# Patient Record
Sex: Female | Born: 1980 | Race: White | Hispanic: No | Marital: Single | Smoking: Former smoker
Health system: Southern US, Community
[De-identification: ages and names within clinical notes are randomized; demographics above are authoritative.]

## PROBLEM LIST (undated history)

## (undated) DIAGNOSIS — F419 Anxiety disorder, unspecified: Secondary | ICD-10-CM

## (undated) DIAGNOSIS — F909 Attention-deficit hyperactivity disorder, unspecified type: Secondary | ICD-10-CM

## (undated) HISTORY — PX: TONSILLECTOMY: SUR1361

## (undated) HISTORY — PX: OTHER SURGICAL HISTORY: SHX169

---

## 1999-03-11 ENCOUNTER — Inpatient Hospital Stay (HOSPITAL_COMMUNITY): Admission: AD | Admit: 1999-03-11 | Discharge: 1999-03-11 | Payer: Self-pay | Admitting: Obstetrics

## 2000-02-07 ENCOUNTER — Encounter: Payer: Self-pay | Admitting: Emergency Medicine

## 2000-02-07 ENCOUNTER — Emergency Department (HOSPITAL_COMMUNITY): Admission: EM | Admit: 2000-02-07 | Discharge: 2000-02-07 | Payer: Self-pay | Admitting: Emergency Medicine

## 2000-08-21 ENCOUNTER — Emergency Department (HOSPITAL_COMMUNITY): Admission: EM | Admit: 2000-08-21 | Discharge: 2000-08-21 | Payer: Self-pay | Admitting: *Deleted

## 2000-09-13 ENCOUNTER — Encounter: Admission: RE | Admit: 2000-09-13 | Discharge: 2000-09-13 | Payer: Self-pay | Admitting: Sports Medicine

## 2000-09-13 ENCOUNTER — Encounter: Payer: Self-pay | Admitting: Sports Medicine

## 2000-10-04 ENCOUNTER — Encounter: Payer: Self-pay | Admitting: Sports Medicine

## 2000-10-04 ENCOUNTER — Ambulatory Visit (HOSPITAL_COMMUNITY): Admission: RE | Admit: 2000-10-04 | Discharge: 2000-10-04 | Payer: Self-pay | Admitting: Sports Medicine

## 2001-06-11 ENCOUNTER — Observation Stay (HOSPITAL_COMMUNITY): Admission: RE | Admit: 2001-06-11 | Discharge: 2001-06-12 | Payer: Self-pay | Admitting: Specialist

## 2001-09-17 ENCOUNTER — Encounter: Admission: RE | Admit: 2001-09-17 | Discharge: 2001-09-17 | Payer: Self-pay | Admitting: *Deleted

## 2002-04-03 ENCOUNTER — Encounter: Payer: Self-pay | Admitting: Orthopedic Surgery

## 2002-04-04 ENCOUNTER — Inpatient Hospital Stay (HOSPITAL_COMMUNITY): Admission: RE | Admit: 2002-04-04 | Discharge: 2002-04-05 | Payer: Self-pay | Admitting: Orthopedic Surgery

## 2002-07-25 ENCOUNTER — Observation Stay (HOSPITAL_COMMUNITY): Admission: AD | Admit: 2002-07-25 | Discharge: 2002-07-25 | Payer: Self-pay | Admitting: *Deleted

## 2004-11-22 ENCOUNTER — Other Ambulatory Visit: Admission: RE | Admit: 2004-11-22 | Discharge: 2004-11-22 | Payer: Self-pay | Admitting: Obstetrics and Gynecology

## 2011-08-09 ENCOUNTER — Encounter: Payer: Self-pay | Admitting: *Deleted

## 2011-08-10 ENCOUNTER — Ambulatory Visit: Payer: Self-pay | Admitting: Women's Health

## 2012-01-14 ENCOUNTER — Ambulatory Visit (INDEPENDENT_AMBULATORY_CARE_PROVIDER_SITE_OTHER): Payer: 59 | Admitting: Women's Health

## 2012-01-14 ENCOUNTER — Other Ambulatory Visit (HOSPITAL_COMMUNITY)
Admission: RE | Admit: 2012-01-14 | Discharge: 2012-01-14 | Disposition: A | Discharge status: Home / Self Care | Payer: 59 | Source: Ambulatory Visit | Attending: Obstetrics and Gynecology | Admitting: Obstetrics and Gynecology

## 2012-01-14 ENCOUNTER — Encounter: Payer: Self-pay | Admitting: Women's Health

## 2012-01-14 VITALS — BP 110/78 | Ht 68.0 in | Wt 265.0 lb

## 2012-01-14 DIAGNOSIS — IMO0001 Reserved for inherently not codable concepts without codable children: Secondary | ICD-10-CM

## 2012-01-14 DIAGNOSIS — Z309 Encounter for contraceptive management, unspecified: Secondary | ICD-10-CM

## 2012-01-14 DIAGNOSIS — Z13220 Encounter for screening for lipoid disorders: Secondary | ICD-10-CM

## 2012-01-14 DIAGNOSIS — Z01419 Encounter for gynecological examination (general) (routine) without abnormal findings: Secondary | ICD-10-CM | POA: Insufficient documentation

## 2012-01-14 DIAGNOSIS — F17200 Nicotine dependence, unspecified, uncomplicated: Secondary | ICD-10-CM

## 2012-01-14 DIAGNOSIS — Z833 Family history of diabetes mellitus: Secondary | ICD-10-CM

## 2012-01-14 DIAGNOSIS — Z113 Encounter for screening for infections with a predominantly sexual mode of transmission: Secondary | ICD-10-CM

## 2012-01-14 DIAGNOSIS — E669 Obesity, unspecified: Secondary | ICD-10-CM

## 2012-01-14 DIAGNOSIS — F17210 Nicotine dependence, cigarettes, uncomplicated: Secondary | ICD-10-CM

## 2012-01-14 MED ORDER — NORETHIN ACE-ETH ESTRAD-FE 1-20 MG-MCG PO TABS: 1.0000 | ORAL_TABLET | Freq: Every day | ORAL | Status: DC

## 2012-01-14 NOTE — Progress Notes (Signed)
Alexandra Schmitt Dec 20, 1980 [id number]    History:    The patient presents for annual exam.  Monthly 6-7 day cycle/condoms and withdrawal. Requested oral contraceptives used in the past without problem. Smokes less than half pack of cigarettes per day. Minimal healthcare in approximately 10 years. Occasional urinary frequency without pain. Complains of occasional anxiety/situation.  Past medical history, past surgical history, family history and social history were all reviewed and documented in the EPIC chart. Armed forces training and education officer. History of a 32 year old son placed for adoption of birth.   ROS:  A  ROS was performed and pertinent positives and negatives are included in the history.  Exam:  Filed Vitals:   01/14/12 0936  BP: 110/78    General appearance:  Normal Head/Neck:  Normal, without cervical or supraclavicular adenopathy. Thyroid:  Symmetrical, normal in size, without palpable masses or nodularity. Respiratory  Effort:  Normal  Auscultation:  Clear without wheezing or rhonchi Cardiovascular  Auscultation:  Regular rate, without rubs, murmurs or gallops  Edema/varicosities:  Not grossly evident Abdominal  Soft,nontender, without masses, guarding or rebound.  Liver/spleen:  No organomegaly noted  Hernia:  None appreciated  Skin  Inspection:  Grossly normal  Palpation:  Grossly normal Neurologic/psychiatric  Orientation:  Normal with appropriate conversation.  Mood/affect:  Normal  Genitourinary    Breasts: Examined lying and sitting.     Right: Without masses, retractions, discharge or axillary adenopathy.     Left: Without masses, retractions, discharge or axillary adenopathy.   Inguinal/mons:  Normal without inguinal adenopathy  External genitalia:  Normal  BUS/Urethra/Skene's glands:  Normal  Bladder:  Normal  Vagina:  Normal  Cervix:  Normal  Uterus:  normal in size, shape and contour.  Midline and mobile  Adnexa/parametria:     Rt: Without masses or  tenderness.   Lt: Without masses or tenderness.  Anus and perineum: Normal  Digital rectal exam: Normal sphincter tone without palpated masses or tenderness  Assessment/Plan:  31 y.o. S WF G1 P1(adopted) for annual exam.     Minimal healthcare for 10 years Normal GYN exam desiring contraception STD screening Obesity Smoker less than one half pack per day  Plan: Contraception options reviewed, Loestrin 1/20 prescription, proper use, slight risk for blood clots and strokes reviewed. Start up instructions condoms especially until then and first month and for infection control encouraged. Smoking cessation discussed, declines chantix. SBE's, increase exercise, decrease calories for wt loss, calcium rich diet, MVI daily encouraged. CBC, glucose, lipid profile, UA, Pap, GC/Chlamydia, HIV, hep B and C., RPR.    Rosalie Buenaventura J WHNP, 1:21 PM 01/14/2012

## 2012-01-14 NOTE — Patient Instructions (Signed)

## 2012-01-15 LAB — URINALYSIS W MICROSCOPIC + REFLEX CULTURE
Bacteria, UA: NONE SEEN
Crystals: NONE SEEN
Ketones, ur: NEGATIVE mg/dL
Nitrite: NEGATIVE
Protein, ur: NEGATIVE mg/dL
Specific Gravity, Urine: 1.027 (ref 1.005–1.030)
Urobilinogen, UA: 0.2 mg/dL (ref 0.0–1.0)

## 2012-01-15 LAB — CBC WITH DIFFERENTIAL/PLATELET
Basophils Absolute: 0 10*3/uL (ref 0.0–0.1)
Basophils Relative: 1 % (ref 0–1)
Eosinophils Relative: 2 % (ref 0–5)
HCT: 46.1 % — ABNORMAL HIGH (ref 36.0–46.0)
MCH: 30.2 pg (ref 26.0–34.0)
MCHC: 31.9 g/dL (ref 30.0–36.0)
MCV: 94.9 fL (ref 78.0–100.0)
Monocytes Absolute: 0.5 10*3/uL (ref 0.1–1.0)
RDW: 13.5 % (ref 11.5–15.5)

## 2012-01-15 LAB — HEPATITIS C ANTIBODY: HCV Ab: NEGATIVE

## 2012-01-15 LAB — HIV ANTIBODY (ROUTINE TESTING W REFLEX): HIV: NONREACTIVE

## 2012-01-15 LAB — LIPID PANEL: Total CHOL/HDL Ratio: 5.8 Ratio

## 2012-01-15 LAB — GC/CHLAMYDIA PROBE AMP, GENITAL: GC Probe Amp, Genital: NEGATIVE

## 2012-06-16 ENCOUNTER — Encounter: Payer: Self-pay | Admitting: Gynecology

## 2012-06-16 ENCOUNTER — Ambulatory Visit (INDEPENDENT_AMBULATORY_CARE_PROVIDER_SITE_OTHER): Payer: BC Managed Care – PPO | Admitting: Gynecology

## 2012-06-16 VITALS — BP 134/90

## 2012-06-16 DIAGNOSIS — F32A Depression, unspecified: Secondary | ICD-10-CM | POA: Insufficient documentation

## 2012-06-16 DIAGNOSIS — F329 Major depressive disorder, single episode, unspecified: Secondary | ICD-10-CM | POA: Insufficient documentation

## 2012-06-16 MED ORDER — VILAZODONE HCL 10 MG PO TABS: 10.0000 mg | ORAL_TABLET | Freq: Every day | ORAL | Status: DC

## 2012-06-16 NOTE — Patient Instructions (Addendum)
Depression  Depression is a strong emotion of feeling unhappy that can last for weeks, months, or even longer. Depression causes problems with the ability to function in life. It upsets your:   Relationships.   Sleep.   Eating habits.   Work habits.  HOME CARE  Take all medicine as told by your doctor.   Talk with a therapist, counselor, or friend.   Eat a healthy diet.   Exercise regularly.   Do not drink alcohol or use drugs.  GET HELP RIGHT AWAY IF: You start to have thoughts about hurting yourself or others. MAKE SURE YOU:  Understand these instructions.   Will watch your condition.   Will get help right away if you are not doing well or get worse.  Document Released: 12/01/2010 Document Revised: 10/18/2011 Document Reviewed: 12/01/2010 [facility] Patient Information 2012 [place], [place].  Anxiety and Panic Attacks Your caregiver has informed you that you are having an anxiety or panic attack. There may be many forms of this. Most of the time these attacks come suddenly and without warning. They come at any time of day, including periods of sleep, and at any time of life. They may be strong and unexplained. Although panic attacks are very scary, they are physically harmless. Sometimes the cause of your anxiety is not known. Anxiety is a protective mechanism of the body in its fight or flight mechanism. Most of these perceived danger situations are actually nonphysical situations (such as anxiety over losing a job). CAUSES  The causes of an anxiety or panic attack are many. Panic attacks may occur in otherwise healthy people given a certain set of circumstances. There may be a genetic cause for panic attacks. Some medications may also have anxiety as a side effect. SYMPTOMS  Some of the most common feelings are:  Intense terror.   Dizziness, feeling faint.   Hot and cold flashes.   Fear of going crazy.   Feelings that nothing is real.   Sweating.   Shaking.    Chest pain or a fast heartbeat (palpitations).   Smothering, choking sensations.   Feelings of impending doom and that death is near.   Tingling of extremities, this may be from over-breathing.   Altered reality (derealization).   Being detached from yourself (depersonalization).  Several symptoms can be present to make up anxiety or panic attacks. DIAGNOSIS  The evaluation by your caregiver will depend on the type of symptoms you are experiencing. The diagnosis of anxiety or panic attack is made when no physical illness can be determined to be a cause of the symptoms. TREATMENT  Treatment to prevent anxiety and panic attacks may include:  Avoidance of circumstances that cause anxiety.   Reassurance and relaxation.   Regular exercise.   Relaxation therapies, such as yoga.   Psychotherapy with a psychiatrist or therapist.   Avoidance of caffeine, alcohol and illegal drugs.   Prescribed medication.  SEEK IMMEDIATE MEDICAL CARE IF:   You experience panic attack symptoms that are different than your usual symptoms.   You have any worsening or concerning symptoms.  Document Released: 10/29/2005 Document Revised: 10/18/2011 Document Reviewed: 03/02/2010 [facility] [place] [facility] Patient Information 2012 [place], [place].
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
## 2012-07-09 ENCOUNTER — Telehealth: Payer: Self-pay | Admitting: *Deleted

## 2012-07-09 ENCOUNTER — Telehealth: Payer: Self-pay | Admitting: Women's Health

## 2012-07-09 NOTE — Telephone Encounter (Signed)
Phone number unavailable.
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
## 2012-07-09 NOTE — Telephone Encounter (Signed)
Pt mother Alexandra Schmitt is calling to follow up with last OV 06/16/12. She would like for her daughter to proceed with a therapist or psychiatrist as directed in office note. Please advise

## 2012-07-09 NOTE — Telephone Encounter (Signed)
Per JF verbal message okay to set up appointment with Dr.McKinney office. Per Dr.McKinney office pt will need to call and speak with staff to go over appointment process. Mother given number to give to pt to call. Office notes faxed.

## 2014-09-13 ENCOUNTER — Encounter: Payer: Self-pay | Admitting: Gynecology

## 2015-02-17 ENCOUNTER — Other Ambulatory Visit: Payer: Self-pay | Admitting: Women's Health

## 2015-02-17 ENCOUNTER — Encounter: Payer: Self-pay | Admitting: Women's Health

## 2015-02-17 MED ORDER — NORETHINDRONE 0.35 MG PO TABS: 1.0000 | ORAL_TABLET | Freq: Every day | ORAL | Status: DC

## 2015-02-23 ENCOUNTER — Encounter: Payer: Self-pay | Admitting: Women's Health

## 2015-02-23 ENCOUNTER — Other Ambulatory Visit (HOSPITAL_COMMUNITY)
Admission: RE | Admit: 2015-02-23 | Discharge: 2015-02-23 | Disposition: A | Discharge status: Home / Self Care | Payer: BLUE CROSS/BLUE SHIELD | Source: Ambulatory Visit | Attending: Gynecology | Admitting: Gynecology

## 2015-02-23 ENCOUNTER — Ambulatory Visit (INDEPENDENT_AMBULATORY_CARE_PROVIDER_SITE_OTHER): Payer: BLUE CROSS/BLUE SHIELD | Admitting: Women's Health

## 2015-02-23 VITALS — BP 115/80 | Ht 66.0 in | Wt 264.0 lb

## 2015-02-23 DIAGNOSIS — Z833 Family history of diabetes mellitus: Secondary | ICD-10-CM | POA: Diagnosis not present

## 2015-02-23 DIAGNOSIS — Z13220 Encounter for screening for lipoid disorders: Secondary | ICD-10-CM | POA: Diagnosis not present

## 2015-02-23 DIAGNOSIS — Z01419 Encounter for gynecological examination (general) (routine) without abnormal findings: Secondary | ICD-10-CM | POA: Insufficient documentation

## 2015-02-23 DIAGNOSIS — Z30011 Encounter for initial prescription of contraceptive pills: Secondary | ICD-10-CM | POA: Diagnosis not present

## 2015-02-23 DIAGNOSIS — F17210 Nicotine dependence, cigarettes, uncomplicated: Secondary | ICD-10-CM

## 2015-02-23 LAB — CBC WITH DIFFERENTIAL/PLATELET
Basophils Absolute: 0 10*3/uL (ref 0.0–0.1)
Basophils Relative: 0 % (ref 0–1)
Eosinophils Absolute: 0.3 10*3/uL (ref 0.0–0.7)
Eosinophils Relative: 3 % (ref 0–5)
HEMATOCRIT: 43.2 % (ref 36.0–46.0)
Hemoglobin: 14.4 g/dL (ref 12.0–15.0)
LYMPHS ABS: 2.6 10*3/uL (ref 0.7–4.0)
LYMPHS PCT: 31 % (ref 12–46)
MCH: 29.6 pg (ref 26.0–34.0)
MCHC: 33.3 g/dL (ref 30.0–36.0)
MCV: 88.7 fL (ref 78.0–100.0)
MONO ABS: 0.7 10*3/uL (ref 0.1–1.0)
MONOS PCT: 8 % (ref 3–12)
MPV: 9.5 fL (ref 8.6–12.4)
NEUTROS ABS: 4.9 10*3/uL (ref 1.7–7.7)
Neutrophils Relative %: 58 % (ref 43–77)
Platelets: 380 10*3/uL (ref 150–400)
RBC: 4.87 MIL/uL (ref 3.87–5.11)
RDW: 14.2 % (ref 11.5–15.5)
WBC: 8.5 10*3/uL (ref 4.0–10.5)

## 2015-02-23 LAB — LIPID PANEL
CHOL/HDL RATIO: 6.4 ratio
Cholesterol: 187 mg/dL (ref 0–200)
HDL: 29 mg/dL — AB (ref >=46)
LDL Cholesterol: 99 mg/dL (ref 0–99)
Triglycerides: 293 mg/dL — ABNORMAL HIGH (ref <150)
VLDL: 59 mg/dL — AB (ref 0–40)

## 2015-02-23 LAB — GLUCOSE, RANDOM: GLUCOSE: 97 mg/dL (ref 70–99)

## 2015-02-23 MED ORDER — NORETHINDRONE 0.35 MG PO TABS: 1.0000 | ORAL_TABLET | Freq: Every day | ORAL | Status: DC

## 2015-02-23 NOTE — Patient Instructions (Signed)
Cardiac Diet A cardiac diet can help stop heart disease or a stroke from happening. It involves eating less unhealthy fats and eating more healthy fats.  FOODS TO AVOID OR LIMIT  Limit saturated fats. This type of fat is found in oils and dairy products, such as:  Coconut oil.  Palm oil.  Cocoa butter.  Butter.  Avoid trans-fat or hydrogenated oils. These are found in fried or pre-made baked goods, such as:  Margarine.  Pre-made cookies, cakes, and crackers.  Limit processed meats (hot dogs, deli meats, sausage) to 3 ounces a week.  Limit high-fat meats (marbled meats, fried chicken, or chicken with skin) to 3 ounces a week.  Limit salt (sodium) to 1500 milligrams a day.   Limit sweets and drinks with added sugar to no more than 5 servings a week. One serving is:  1 tablespoon of sugar.  1 tablespoon of jelly or jam.   cup sorbet.  1 cup lemonade.   cup regular soda. EAT MORE OF THE FOLLOWING FOODS Fruit  Eat 4to 5 servings a day. One serving of fruit is:  1 medium whole fruit.   cup dried fruit.   cup of fresh, frozen, or canned fruit.   cup 100% fruit juice. Vegetables  Eat 4 to 5 servings a day. One serving is:  1 cup raw leafy vegetables.   cup raw or cooked, cut-up vegetables.   cup vegetable juice. Whole Grains  Eat 3 servings a day (1 ounce equals 1 serving). Legumes (such as beans, peas, and lentils)   Eat at least 4 servings a week ( cup equals 1 serving). Nuts and Seeds   Eat at least 4 servings a week ( cup equals 1 serving). Dietary Fiber  Eat 20 to 30 grams a day. Some foods high in dietary fiber include:  Dried beans.  Citrus fruits.  Apples, bananas.  Broccoli, Brussels sprouts, and eggplant.  Oats. Omega-3 Fats  Eat food with omega-3 fats. You can also take a dietary pill (supplement) that has 1 gram of DHA and EPA. Have 3.5 ounces of fatty fish a week, such as:  Salmon.  Mackerel.  [place]  tuna.  Sardines.  Lake trout.  Herring. PREPARING YOUR FOOD  Broil, bake, steam, or roast foods. Do not fry food. Do not cook food in butter (fat).  Use non-stick cooking sprays.  Remove skin from poultry, such as chicken and [place].  Remove fat from meat.  Take the fat off the top of stews, soups, and gravy.  Use lemon or herbs to flavor food instead of using butter or margarine.  Use nonfat yogurt, salsa, or low-fat dressings for salads. Document Released: 04/29/2012 Document Reviewed: 04/29/2012 [facility] Patient Information 2015 [place]. This information is not intended to replace advice given to you by your health care provider. Make sure you discuss any questions you have with your health care provider. Exercise to Stay Healthy Exercise helps you become and stay healthy. EXERCISE IDEAS AND TIPS Choose exercises that:  You enjoy.  Fit into your day. You do not need to exercise really hard to be healthy. You can do exercises at a slow or medium level and stay healthy. You can:  Stretch before and after working out.  Try yoga, Pilates, or tai chi.  Lift weights.  Walk fast, swim, jog, run, climb stairs, bicycle, dance, or rollerskate.  Take aerobic classes. Exercises that burn about 150 calories:  Running 1  miles in 15 minutes.  Playing volleyball for  45 to 60 minutes.  Washing and waxing a car for 45 to 60 minutes.  Playing touch football for 45 minutes.  Walking 1  miles in 35 minutes.  Pushing a stroller 1  miles in 30 minutes.  Playing basketball for 30 minutes.  Raking leaves for 30 minutes.  Bicycling 5 miles in 30 minutes.  Walking 2 miles in 30 minutes.  Dancing for 30 minutes.  Shoveling snow for 15 minutes.  Swimming laps for 20 minutes.  Walking up stairs for 15 minutes.  Bicycling 4 miles in 15 minutes.  Gardening for 30 to 45 minutes.  Jumping rope for 15 minutes.  Washing windows or floors for 45 to 60  minutes. Document Released: 12/01/2010 Document Revised: 01/21/2012 Document Reviewed: 12/01/2010 [facility] Patient Information 2015 [place], [place]. This information is not intended to replace advice given to you by your health care provider. Make sure you discuss any questions you have with your health care provider. Health Maintenance Adopting a healthy lifestyle and getting preventive care can go a long way to promote health and wellness. Talk with your health care provider about what schedule of regular examinations is right for you. This is a good chance for you to check in with your provider about disease prevention and staying healthy. In between checkups, there are plenty of things you can do on your own. Experts have done a lot of research about which lifestyle changes and preventive measures are most likely to keep you healthy. Ask your health care provider for more information. WEIGHT AND DIET  Eat a healthy diet  Be sure to include plenty of vegetables, fruits, low-fat dairy products, and lean protein.  Do not eat a lot of foods high in solid fats, added sugars, or salt.  Get regular exercise. This is one of the most important things you can do for your health.  Most adults should exercise for at least 150 minutes each week. The exercise should increase your heart rate and make you sweat (moderate-intensity exercise).  Most adults should also do strengthening exercises at least twice a week. This is in addition to the moderate-intensity exercise.  Maintain a healthy weight  Body mass index (BMI) is a measurement that can be used to identify possible weight problems. It estimates body fat based on height and weight. Your health care provider can help determine your BMI and help you achieve or maintain a healthy weight.  For females 87 years of age and older:   A BMI below 18.5 is considered underweight.  A BMI of 18.5 to 24.9 is normal.  A BMI of 25 to 29.9 is considered  overweight.  A BMI of 30 and above is considered obese.  Watch levels of cholesterol and blood lipids  You should start having your blood tested for lipids and cholesterol at 35 years of age, then have this test every 5 years.  You may need to have your cholesterol levels checked more often if:  Your lipid or cholesterol levels are high.  You are older than 35 years of age.  You are at high risk for heart disease.  CANCER SCREENING   Lung Cancer  Lung cancer screening is recommended for adults 75-88 years old who are at high risk for lung cancer because of a history of smoking.  A yearly low-dose CT scan of the lungs is recommended for people who:  Currently smoke.  Have quit within the past 15 years.  Have at least a 30-pack-year history of smoking.  A pack year is smoking an average of one pack of cigarettes a day for 1 year.  Yearly screening should continue until it has been 15 years since you quit.  Yearly screening should stop if you develop a health problem that would prevent you from having lung cancer treatment.  Breast Cancer  Practice breast self-awareness. This means understanding how your breasts normally appear and feel.  It also means doing regular breast self-exams. Let your health care provider know about any changes, no matter how small.  If you are in your 20s or 30s, you should have a clinical breast exam (CBE) by a health care provider every 1-3 years as part of a regular health exam.  If you are 13 or older, have a CBE every year. Also consider having a breast X-ray (mammogram) every year.  If you have a family history of breast cancer, talk to your health care provider about genetic screening.  If you are at high risk for breast cancer, talk to your health care provider about having an MRI and a mammogram every year.  Breast cancer gene (BRCA) assessment is recommended for women who have family members with BRCA-related cancers. BRCA-related  cancers include:  Breast.  Ovarian.  Tubal.  Peritoneal cancers.  Results of the assessment will determine the need for genetic counseling and BRCA1 and BRCA2 testing. Cervical Cancer Routine pelvic examinations to screen for cervical cancer are no longer recommended for nonpregnant women who are considered low risk for cancer of the pelvic organs (ovaries, uterus, and vagina) and who do not have symptoms. A pelvic examination may be necessary if you have symptoms including those associated with pelvic infections. Ask your health care provider if a screening pelvic exam is right for you.   The Pap test is the screening test for cervical cancer for women who are considered at risk.  If you had a hysterectomy for a problem that was not cancer or a condition that could lead to cancer, then you no longer need Pap tests.  If you are older than 65 years, and you have had normal Pap tests for the past 10 years, you no longer need to have Pap tests.  If you have had past treatment for cervical cancer or a condition that could lead to cancer, you need Pap tests and screening for cancer for at least 20 years after your treatment.  If you no longer get a Pap test, assess your risk factors if they change (such as having a new sexual partner). This can affect whether you should start being screened again.  Some women have medical problems that increase their chance of getting cervical cancer. If this is the case for you, your health care provider may recommend more frequent screening and Pap tests.  The human papillomavirus (HPV) test is another test that may be used for cervical cancer screening. The HPV test looks for the virus that can cause cell changes in the cervix. The cells collected during the Pap test can be tested for HPV.  The HPV test can be used to screen women 74 years of age and older. Getting tested for HPV can extend the interval between normal Pap tests from three to five  years.  An HPV test also should be used to screen women of any age who have unclear Pap test results.  After 35 years of age, women should have HPV testing as often as Pap tests.  Colorectal Cancer  This type of cancer can be  detected and often prevented.  Routine colorectal cancer screening usually begins at 35 years of age and continues through 35 years of age.  Your health care provider may recommend screening at an earlier age if you have risk factors for colon cancer.  Your health care provider may also recommend using home test kits to check for hidden blood in the stool.  A small camera at the end of a tube can be used to examine your colon directly (sigmoidoscopy or colonoscopy). This is done to check for the earliest forms of colorectal cancer.  Routine screening usually begins at age 34.  Direct examination of the colon should be repeated every 5-10 years through 35 years of age. However, you may need to be screened more often if early forms of precancerous polyps or small growths are found. Skin Cancer  Check your skin from head to toe regularly.  Tell your health care provider about any new moles or changes in moles, especially if there is a change in a mole's shape or color.  Also tell your health care provider if you have a mole that is larger than the size of a pencil eraser.  Always use sunscreen. Apply sunscreen liberally and repeatedly throughout the day.  Protect yourself by wearing long sleeves, pants, a wide-brimmed hat, and sunglasses whenever you are outside. HEART DISEASE, DIABETES, AND HIGH BLOOD PRESSURE   Have your blood pressure checked at least every 1-2 years. High blood pressure causes heart disease and increases the risk of stroke.  If you are between 74 years and 98 years old, ask your health care provider if you should take aspirin to prevent strokes.  Have regular diabetes screenings. This involves taking a blood sample to check your fasting  blood sugar level.  If you are at a normal weight and have a low risk for diabetes, have this test once every three years after 35 years of age.  If you are overweight and have a high risk for diabetes, consider being tested at a younger age or more often. PREVENTING INFECTION  Hepatitis B  If you have a higher risk for hepatitis B, you should be screened for this virus. You are considered at high risk for hepatitis B if:  You were born in a country where hepatitis B is common. Ask your health care provider which countries are considered high risk.  Your parents were born in a high-risk country, and you have not been immunized against hepatitis B (hepatitis B vaccine).  You have HIV or AIDS.  You use needles to inject street drugs.  You live with someone who has hepatitis B.  You have had sex with someone who has hepatitis B.  You get hemodialysis treatment.  You take certain medicines for conditions, including cancer, organ transplantation, and autoimmune conditions. Hepatitis C  Blood testing is recommended for:  Everyone born from [birth year] through 1966.  Anyone with known risk factors for hepatitis C. Sexually transmitted infections (STIs)  You should be screened for sexually transmitted infections (STIs) including gonorrhea and chlamydia if:  You are sexually active and are younger than 35 years of age.  You are older than 35 years of age and your health care provider tells you that you are at risk for this type of infection.  Your sexual activity has changed since you were last screened and you are at an increased risk for chlamydia or gonorrhea. Ask your health care provider if you are at risk.  If you do not  have HIV, but are at risk, it may be recommended that you take a prescription medicine daily to prevent HIV infection. This is called pre-exposure prophylaxis (PrEP). You are considered at risk if:  You are sexually active and do not regularly use condoms or know  the HIV status of your partner(s).  You take drugs by injection.  You are sexually active with a partner who has HIV. Talk with your health care provider about whether you are at high risk of being infected with HIV. If you choose to begin PrEP, you should first be tested for HIV. You should then be tested every 3 months for as long as you are taking PrEP.  PREGNANCY   If you are premenopausal and you may become pregnant, ask your health care provider about preconception counseling.  If you may become pregnant, take 400 to 800 micrograms (mcg) of folic acid every day.  If you want to prevent pregnancy, talk to your health care provider about birth control (contraception). OSTEOPOROSIS AND MENOPAUSE   Osteoporosis is a disease in which the bones lose minerals and strength with aging. This can result in serious bone fractures. Your risk for osteoporosis can be identified using a bone density scan.  If you are 31 years of age or older, or if you are at risk for osteoporosis and fractures, ask your health care provider if you should be screened.  Ask your health care provider whether you should take a calcium or vitamin D supplement to lower your risk for osteoporosis.  Menopause may have certain physical symptoms and risks.  Hormone replacement therapy may reduce some of these symptoms and risks. Talk to your health care provider about whether hormone replacement therapy is right for you.  HOME CARE INSTRUCTIONS   Schedule regular health, dental, and eye exams.  Stay current with your immunizations.   Do not use any tobacco products including cigarettes, chewing tobacco, or electronic cigarettes.  If you are pregnant, do not drink alcohol.  If you are breastfeeding, limit how much and how often you drink alcohol.  Limit alcohol intake to no more than 1 drink per day for nonpregnant women. One drink equals 12 ounces of beer, 5 ounces of wine, or 1 ounces of hard liquor.  Do not  use street drugs.  Do not share needles.  Ask your health care provider for help if you need support or information about quitting drugs.  Tell your health care provider if you often feel depressed.  Tell your health care provider if you have ever been abused or do not feel safe at home. Document Released: 05/14/2011 Document Revised: 03/15/2014 Document Reviewed: 09/30/2013 [facility] Patient Information 2015 [place], [place]. This information is not intended to replace advice given to you by your health care provider. Make sure you discuss any questions you have with your health care provider.

## 2015-02-23 NOTE — Progress Notes (Signed)
Alexandra FantasiaMegan M Merk 11-10-1981 [id number]    History:    Presents for annual exam.  Regular monthly cycle/not sexually active greater than 3 years. Quit smoking 11/2014. Normal Pap history.  Past medical history, past surgical history, family history and social history were all reviewed and documented in the EPIC chart. Social research officer, [employer] manager. Long-term boyfriend died 3 years ago. Mother emphysema, hypertension, alcohol abuse.  ROS:  A ROS was performed and pertinent positives and negatives are included.  Exam:  Filed Vitals:   02/23/15 1021  BP: 115/80    General appearance:  Normal Thyroid:  Symmetrical, normal in size, without palpable masses or nodularity. Respiratory  Auscultation:  Clear without wheezing or rhonchi Cardiovascular  Auscultation:  Regular rate, without rubs, murmurs or gallops  Edema/varicosities:  Not grossly evident Abdominal  Soft,nontender, without masses, guarding or rebound.  Liver/spleen:  No organomegaly noted  Hernia:  None appreciated  Skin  Inspection:  Grossly normal   Breasts: Examined lying and sitting.     Right: Without masses, retractions, discharge or axillary adenopathy.     Left: Without masses, retractions, discharge or axillary adenopathy. Gentitourinary   Inguinal/mons:  Normal without inguinal adenopathy  External genitalia:  Normal  BUS/Urethra/Skene's glands:  Normal  Vagina:  Normal  Cervix:  Normal  Uterus:   normal in size, shape and contour.  Midline and mobile  Adnexa/parametria:     Rt: Without masses or tenderness.   Lt: Without masses or tenderness.  Anus and perineum: Normal  Digital rectal exam: Normal sphincter tone without palpated masses or tenderness  Assessment/Plan:  34 y.o. S WF G0 1 P1 (son  adopted at birth)  for annual exam question contraception.  Contraception management Obesity Recent smoking cessation  Plan: Contraception options reviewed. IUDs reviewed, will try Micronor, prescription, slight risk for blood  clots and strokes reviewed, reviewed no placebo week, condoms encouraged if sexually active, first month  not contraceptive. SBE's, increase regular exercise and decrease calories for weight loss encouraged. Calcium rich diet,  MVI daily encouraged. Congratulated on smoking cessation. CBC, glucose, lipid panel, UA, Pap with HR HPV typing, new screening guidelines reviewed. Last Pap 2013.      Harrington ChallengerYOUNG,NANCY J North Bay Vacavalley HospitalWHNP, 11:51 AM 02/23/2015

## 2015-02-24 LAB — URINALYSIS, ROUTINE W REFLEX MICROSCOPIC
BILIRUBIN URINE: NEGATIVE
Glucose, UA: NEGATIVE mg/dL
Hgb urine dipstick: NEGATIVE
Ketones, ur: NEGATIVE mg/dL
LEUKOCYTES UA: NEGATIVE
Nitrite: NEGATIVE
Protein, ur: NEGATIVE mg/dL
SPECIFIC GRAVITY, URINE: 1.017 (ref 1.005–1.030)
UROBILINOGEN UA: 0.2 mg/dL (ref 0.0–1.0)
pH: 6 (ref 5.0–8.0)

## 2015-02-24 LAB — CYTOLOGY - PAP

## 2017-03-27 ENCOUNTER — Encounter: Payer: Self-pay | Admitting: Gynecology

## 2022-03-05 ENCOUNTER — Other Ambulatory Visit (HOSPITAL_BASED_OUTPATIENT_CLINIC_OR_DEPARTMENT_OTHER): Payer: Self-pay | Admitting: Neurological Surgery

## 2022-03-05 DIAGNOSIS — G959 Disease of spinal cord, unspecified: Secondary | ICD-10-CM

## 2022-03-10 ENCOUNTER — Ambulatory Visit (HOSPITAL_BASED_OUTPATIENT_CLINIC_OR_DEPARTMENT_OTHER)
Admission: RE | Admit: 2022-03-10 | Discharge: 2022-03-10 | Disposition: A | Discharge status: Home / Self Care | Payer: Managed Care, Other (non HMO) | Source: Ambulatory Visit | Attending: Neurological Surgery | Admitting: Neurological Surgery

## 2022-03-10 DIAGNOSIS — G959 Disease of spinal cord, unspecified: Secondary | ICD-10-CM

## 2022-03-10 IMAGING — MR MR CERVICAL SPINE W/O CM
4 of 5 series · 30 of 48 positions shown · non-contrast
Comparison: None.

CLINICAL DATA: Whole-body numbness and weakness, symptoms following
a fall on [DATE]

EXAM:
MRI CERVICAL SPINE WITHOUT CONTRAST
TECHNIQUE: Multiplanar, multisequence MR imaging of the cervical spine was
performed. No intravenous contrast was administered.

[Series 2: T2 · sagittal · 3.0mm · 0.69mm/px · 5 of 14 slices shown (1 of 3)]
[im 1/14]
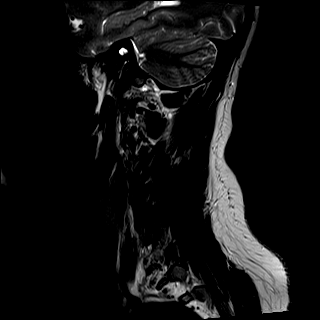
[im 4/14]
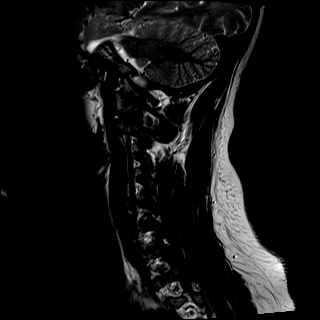
[im 7/14]
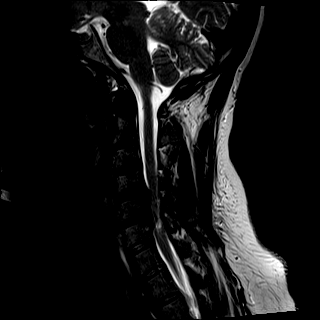
[im 10/14]
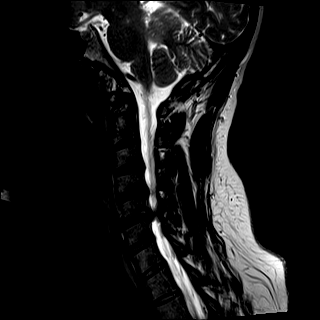
[im 14/14]
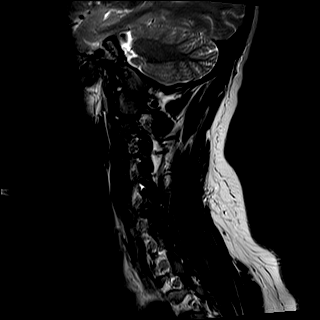

[Series 3: T1 · sagittal · 3.0mm · 0.69mm/px · 5 of 14 slices shown]
[im 1/14]
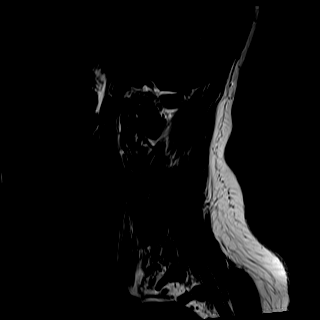
[im 4/14]
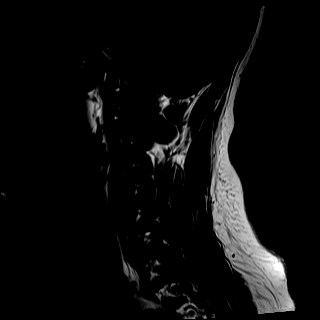
[im 7/14]
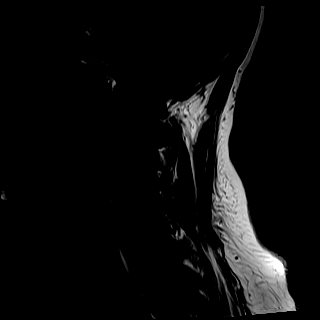
[im 10/14]
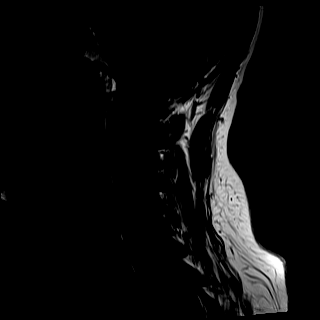
[im 14/14]
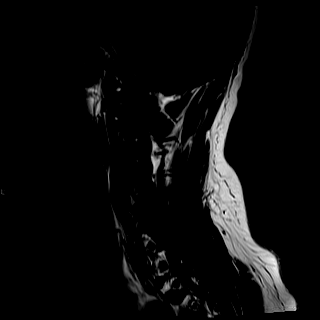

[Series 5: T2 · axial · 3.0mm · 0.62mm/px · z∈[-60,+68]mm · 10 of 40 slices shown (2 of 3)]
[im 3/40]
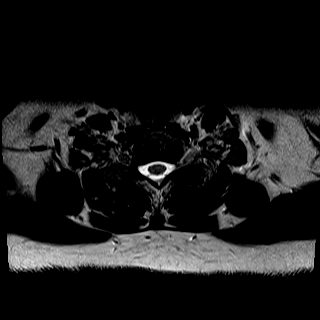
[im 6/40]
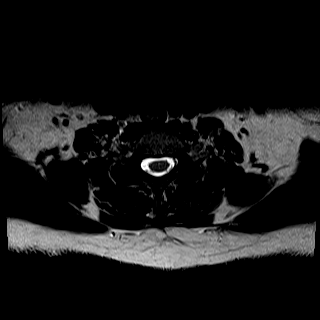
[im 8/40]
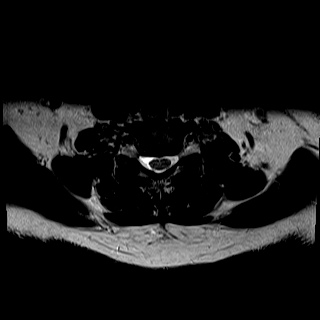
[im 14/40]
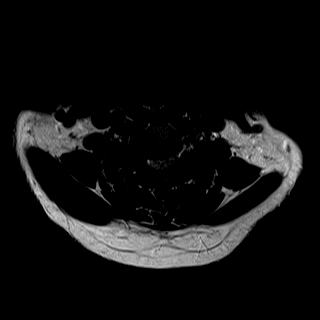
[im 19/40]
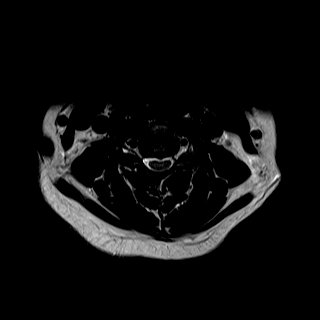
[im 21/40]
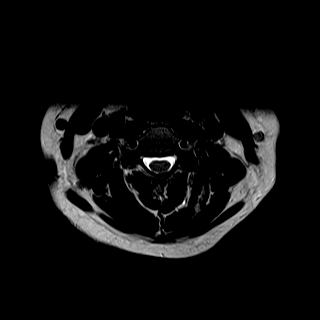
[im 24/40]
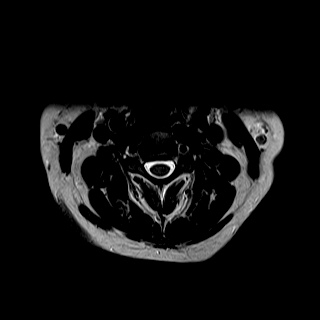
[im 29/40]
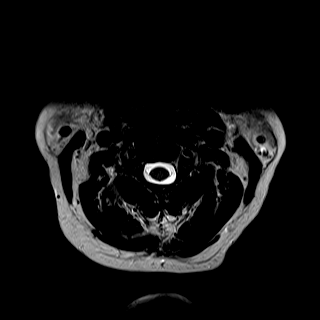
[im 34/40]
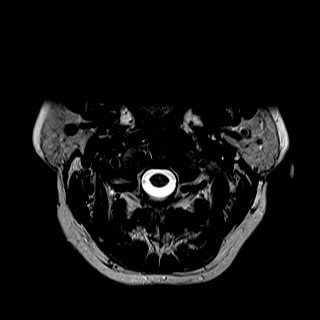
[im 40/40]
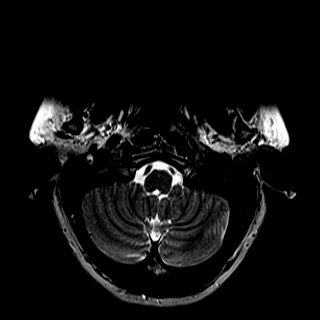

[Series 6: T2 · axial · 3.0mm · 0.39mm/px · z∈[-60,+68]mm · 10 of 40 slices shown (3 of 3)]
[im 3/40]
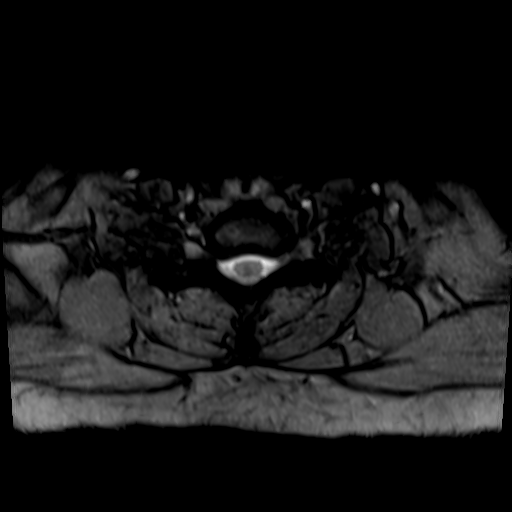
[im 6/40]
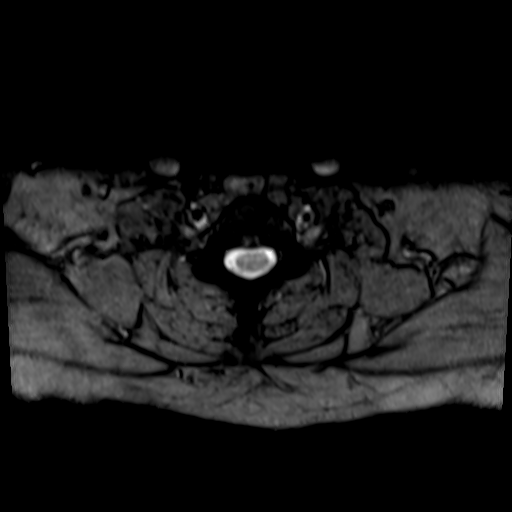
[im 8/40]
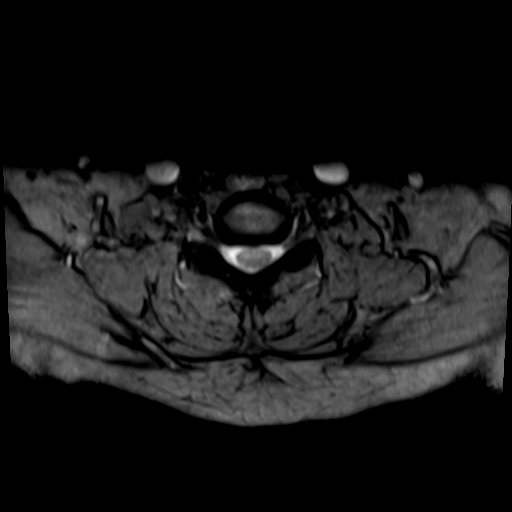
[im 14/40]
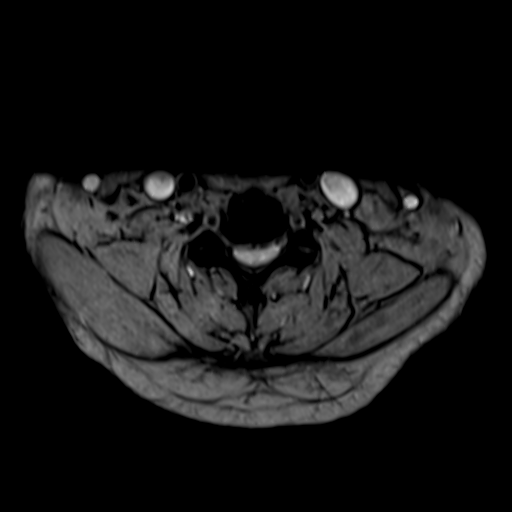
[im 19/40]
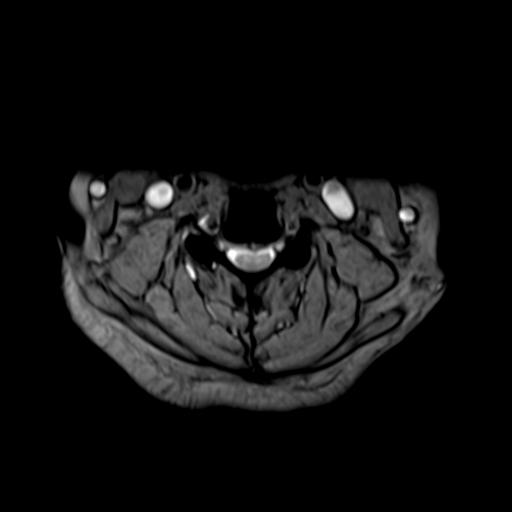
[im 21/40]
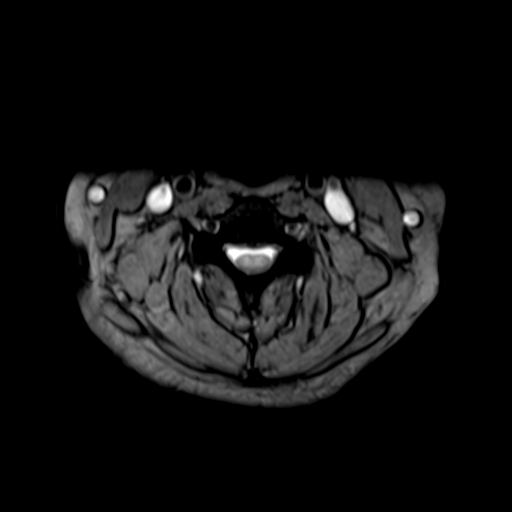
[im 24/40]
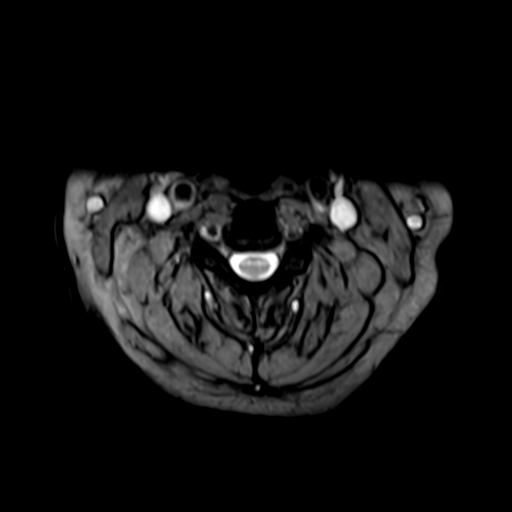
[im 29/40]
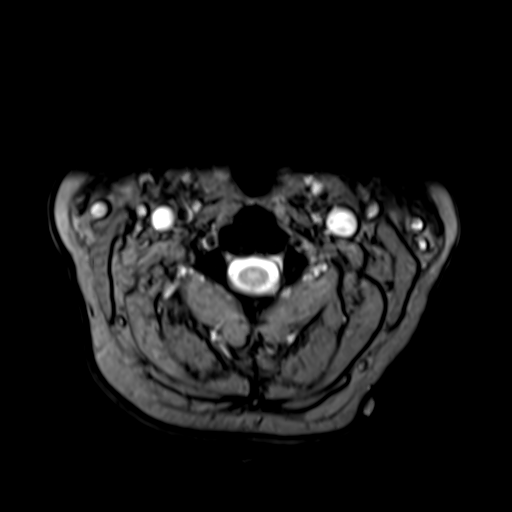
[im 34/40]
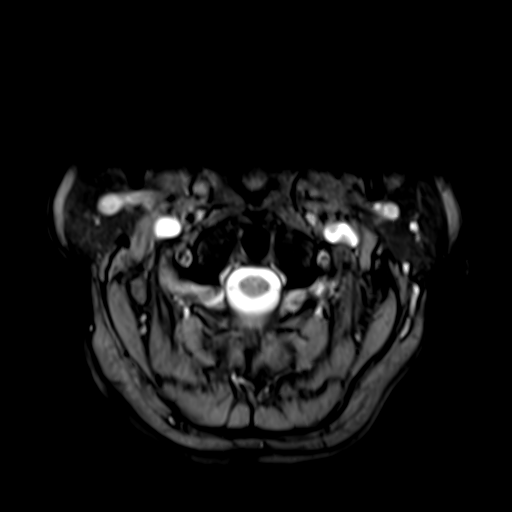
[im 40/40]
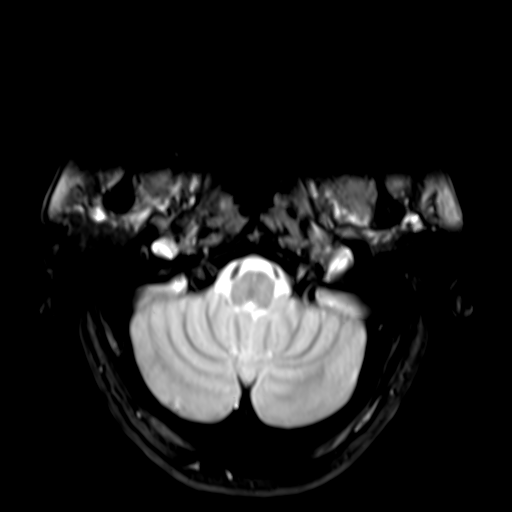

[30 of 48 positions shown; findings below may reference images not displayed]

FINDINGS: Alignment: Normal.

Vertebrae: Vertebral body heights are preserved. Marrow signal is
normal. There is no suspicious marrow signal abnormality or marrow
edema.

Cord: There is cord signal abnormality at C5-C6 consistent with
edema/myelomalacia. Cord is otherwise normal in signal and
morphology.

Posterior Fossa, vertebral arteries, paraspinal tissues: The imaged
posterior fossa is unremarkable. The vertebral artery flow voids are
normal. Paraspinal soft tissues are unremarkable.

Disc levels:

There is disc desiccation and narrowing at C4-C5 and C5-C6. The
other disc heights are preserved.

C2-C3: No significant spinal canal or neural foraminal stenosis

C3-C4: There is mild right uncovertebral ridging resulting in mild
right and no significant left neural foraminal stenosis and no
significant spinal canal stenosis

C4-C5: There is a prominent central protrusion resulting in moderate
to severe spinal canal stenosis with mass effect on the ventral cord
surface and mild right worse than left neural foraminal stenosis

C5-C6: There is a posterior disc osteophyte complex with a prominent
central protrusion/inferior extrusion, right worse than left
uncovertebral ridging, and right worse than left facet arthropathy
resulting in severe spinal canal stenosis with cord compression and
cord signal abnormality and severe right and moderate left neural
foraminal stenosis.

C6-C7: No significant spinal canal or neural foraminal stenosis

C7-T1: No significant spinal canal or neural foraminal stenosis.
IMPRESSION: 1. Prominent protrusion/inferiorly migrated extrusion at C5-C6
resulting in severe spinal canal stenosis with cord compression and
cord edema/myelomalacia. Uncovertebral ridging and facet arthropathy
also contributes to severe right and moderate left neural foraminal
stenosis at this level.
2. Prominent central protrusion at C4-C5 resulting in moderate to
severe spinal canal stenosis with cord compression but no definite
cord signal abnormality.

These results were called by telephone at the time of interpretation
on [DATE] at [DATE] to provider TIGER , who verbally
acknowledged these results.

## 2022-03-10 IMAGING — MR MR LUMBAR SPINE W/O CM
4 of 5 series · 24 of 48 positions shown · non-contrast
Comparison: None.

CLINICAL DATA: Generalized whole-body numbness and weakness
following a fall on [DATE]

EXAM:
MRI LUMBAR SPINE WITHOUT CONTRAST
TECHNIQUE: Multiplanar, multisequence MR imaging of the lumbar spine was
performed. No intravenous contrast was administered.

[Series 3: T1 · sagittal · 4.0mm · 0.81mm/px · 6 of 14 slices shown (1 of 2)]
[im 1/14]
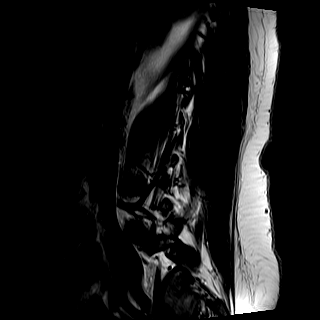
[im 3/14]
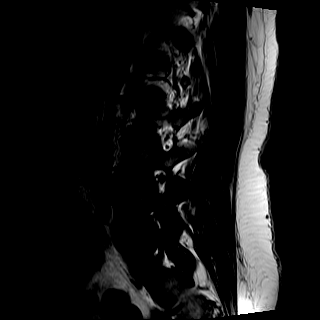
[im 6/14]
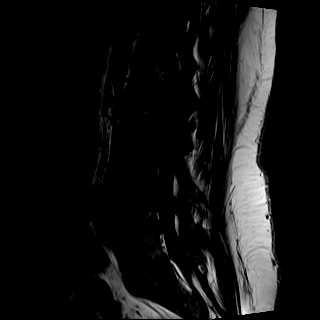
[im 8/14]
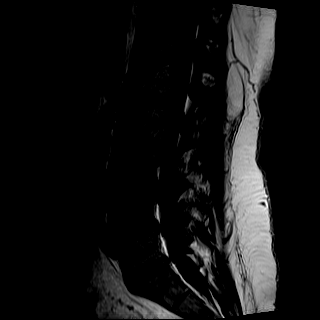
[im 11/14]
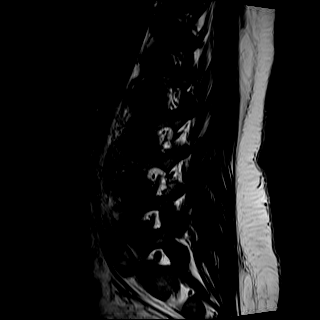
[im 14/14]
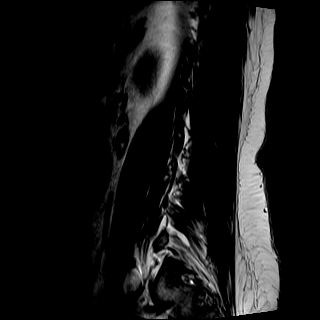

[Series 4: T2 · sagittal · 4.0mm · 0.81mm/px · 5 of 14 slices shown (1 of 2)]
[im 1/14]
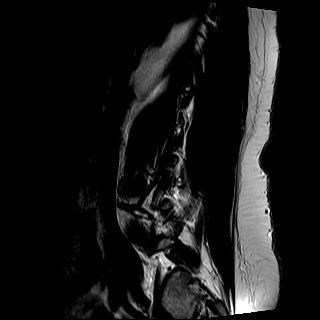
[im 4/14]
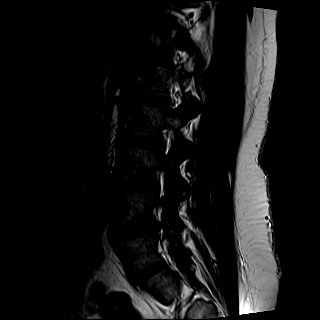
[im 7/14]
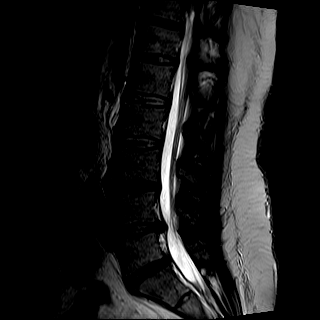
[im 10/14]
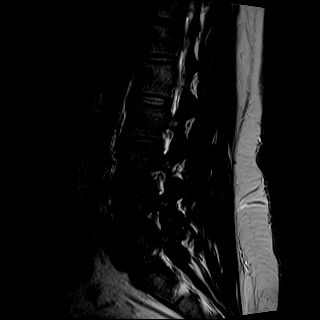
[im 14/14]
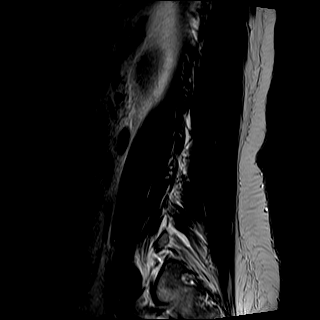

[Series 6: T2 · axial · 4.0mm · 0.39mm/px · z∈[-510,-315]mm · 10 of 42 slices shown (2 of 2)]
[im 3/42]
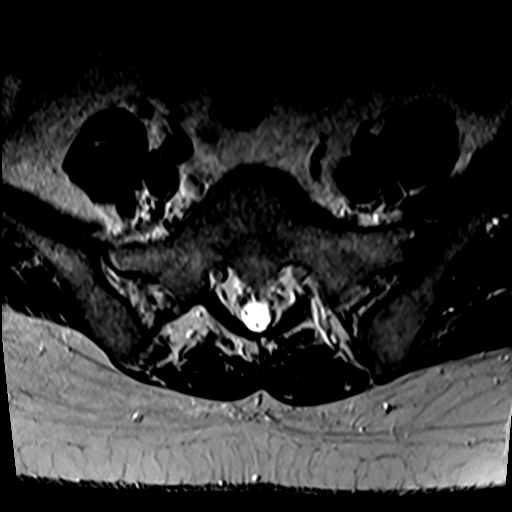
[im 6/42]
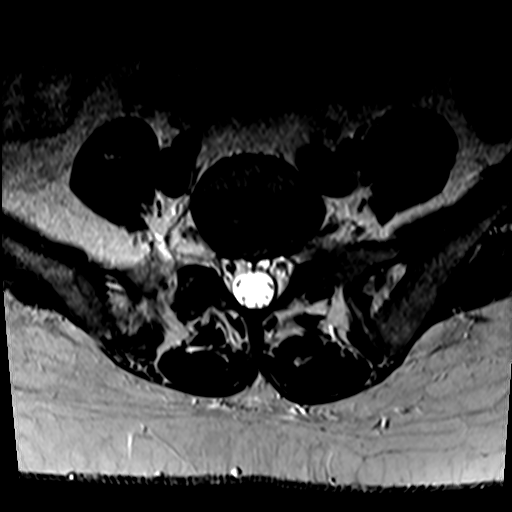
[im 9/42]
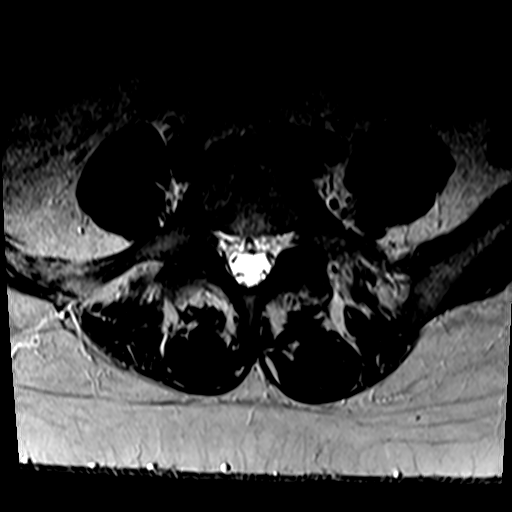
[im 14/42]
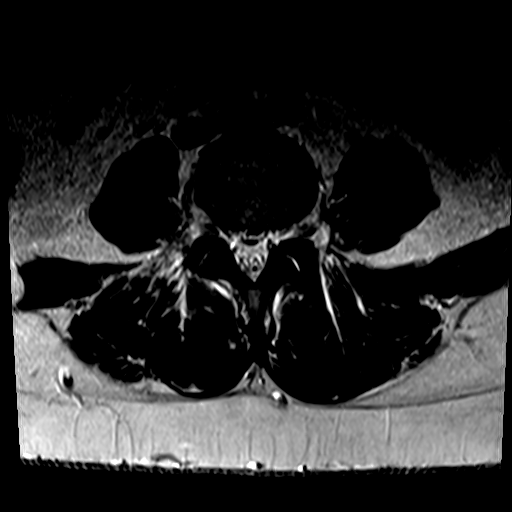
[im 20/42]
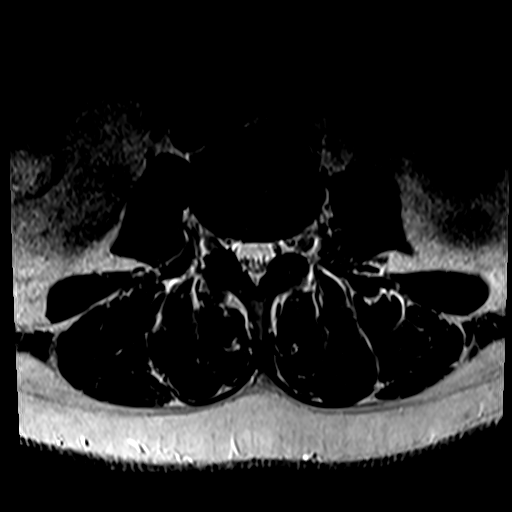
[im 22/42]
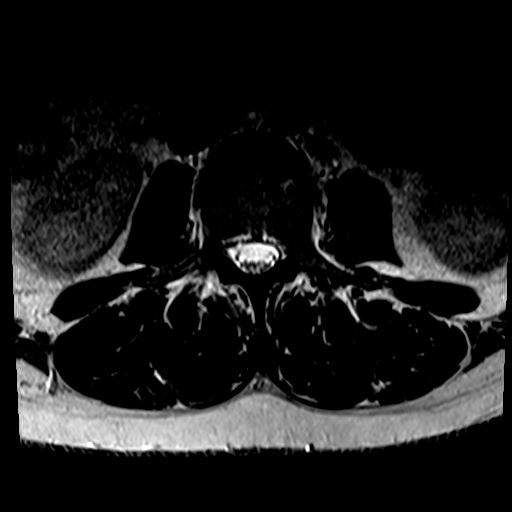
[im 25/42]
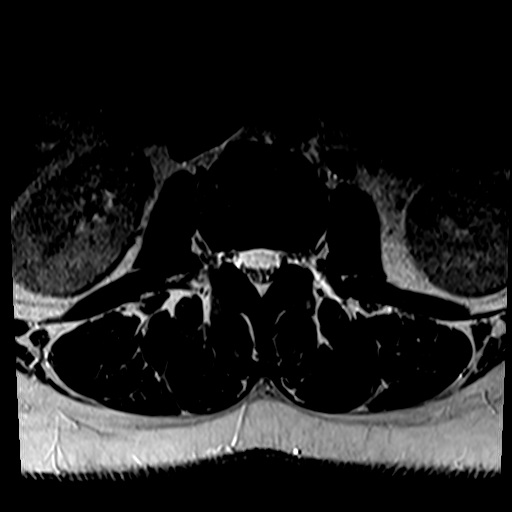
[im 31/42]
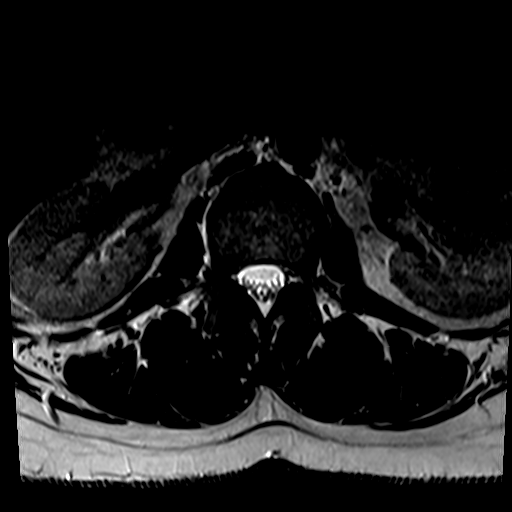
[im 36/42]
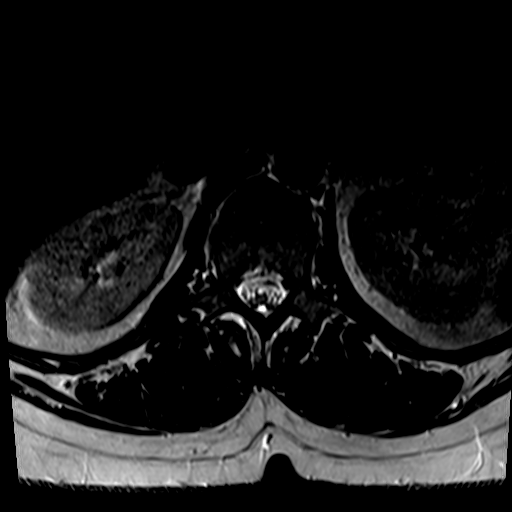
[im 42/42]
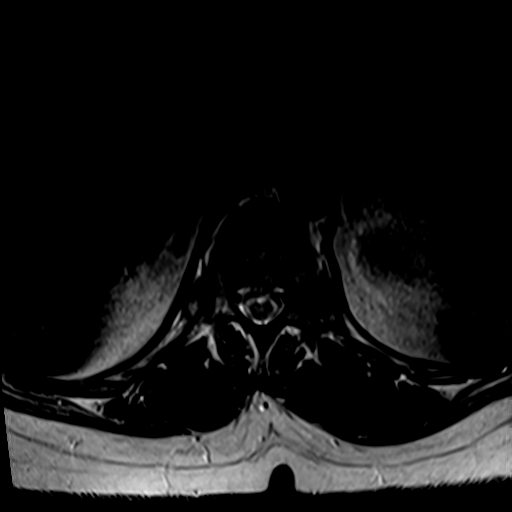

[Series 7: T1 · axial · 4.0mm · 0.39mm/px · z∈[-495,-345]mm · 3 of 42 slices shown (2 of 2)]
[im 6/42]
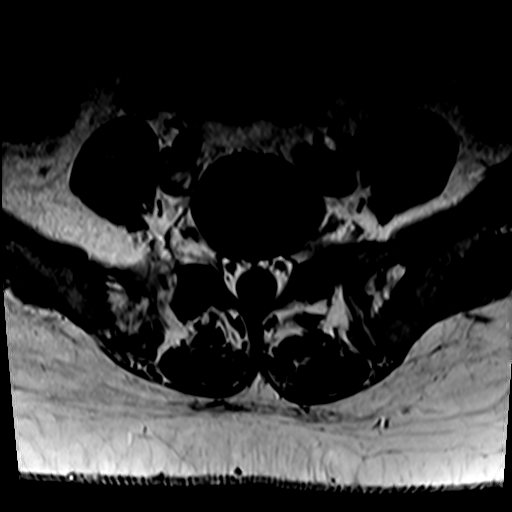
[im 22/42]
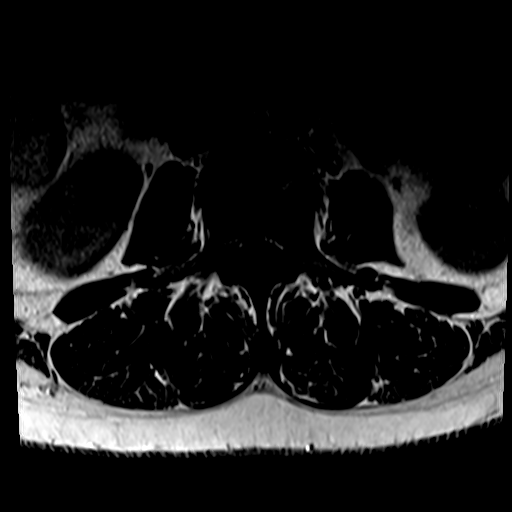
[im 36/42]
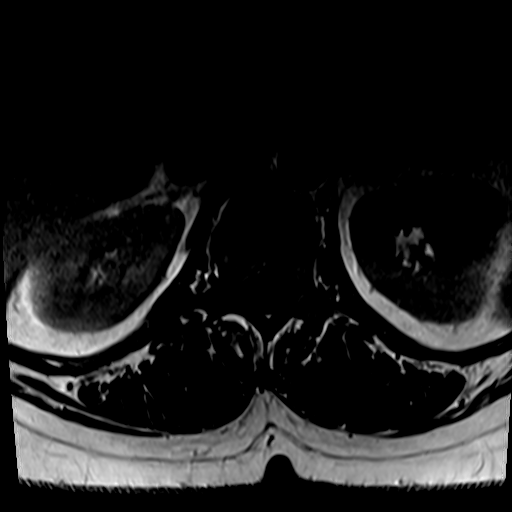

[24 of 48 positions shown; findings below may reference images not displayed]

FINDINGS: Segmentation: Standard; the lowest formed disc space is designated
L5-S1.

Alignment:  Normal.

Vertebrae: There is mild posterior wedge deformity of the T12
vertebral body, likely congenital. Otherwise, vertebral body heights
are preserved. Background marrow signal is normal. There is a small
benign intraosseous hemangioma in the L3 vertebral body. There is no
suspicious marrow signal abnormality.

There is T1 hypointensity with associated T2/STIR hyperintensity in
the T12 pedicles, right more than left raising the possibility of
acute to subacute fractures. There is a possible fracture plane seen
on the right (3-5).

Conus medullaris and cauda equina: Conus extends to the L1 level.
Conus and cauda equina appear normal.

Paraspinal and other soft tissues: Unremarkable.

Disc levels:

There is mild disc desiccation and narrowing from L3-L4 through
L5-S1.

T12-L1: No significant spinal canal or neural foraminal stenosis

L1-L2: No significant spinal canal or neural foraminal stenosis

L2-L3: There is mild bilateral facet arthropathy without significant
spinal canal or neural foraminal stenosis

L3-L4: There is a mild disc bulge and mild bilateral facet
arthropathy without significant spinal canal or neural foraminal
stenosis

L4-L5: There is a prominent central protrusion and mild bilateral
facet arthropathy resulting in narrowing of the subarticular zones
with possible irritation of the traversing right L5 nerve root
without significant neural foraminal stenosis

L5-S1: There is right worse than left facet arthropathy without
significant spinal canal or neural foraminal stenosis.
IMPRESSION: 1. T1 hypointensity with associated T2/STIR hyperintensity in the
bilateral T12 pedicles, right more than left, raise suspicion for
acute to subacute fractures. Correlate with point tenderness, and
consider CT to evaluate for a fracture plane.
2. Prominent central protrusion at L4-L5 which narrows the
subarticular zones and may irritate the traversing right L5 nerve
root.
3. Overall mild degenerative changes throughout the remainder of the
lumbar spine as above without significant spinal canal or neural
foraminal stenosis.

These results were called by telephone at the time of interpretation
on [DATE] at [DATE] to provider LOTMAN , who verbally
acknowledged these results.

## 2022-03-12 ENCOUNTER — Encounter (HOSPITAL_COMMUNITY): Payer: Self-pay | Admitting: Neurological Surgery

## 2022-03-12 NOTE — Anesthesia Preprocedure Evaluation (Addendum)
Anesthesia Evaluation  ?Patient identified by MRN, date of birth, ID band ?Patient awake ? ? ? ?Reviewed: ?Allergy & Precautions, NPO status , Patient's Chart, lab work & pertinent test results ? ?Airway ?Mallampati: II ? ?TM Distance: >3 FB ?Neck ROM: Full ? ? ? Dental ?no notable dental hx. ? ?  ?Pulmonary ?neg pulmonary ROS, former smoker,  ?  ?Pulmonary exam normal ? ? ? ? ? ? ? Cardiovascular ?negative cardio ROS ? ? ?Rhythm:Regular Rate:Normal ? ? ?  ?Neuro/Psych ?Anxiety Depression negative neurological ROS ?   ? GI/Hepatic ?negative GI ROS, Neg liver ROS,   ?Endo/Other  ?negative endocrine ROS ? Renal/GU ?negative Renal ROS  ? ?  ?Musculoskeletal ?Cervical myelopathy  ? Abdominal ?Normal abdominal exam  (+)   ?Peds ? Hematology ?negative hematology ROS ?(+)   ?Anesthesia Other Findings ? ? Reproductive/Obstetrics ? ?  ? ? ? ? ? ? ? ? ? ? ? ? ? ?  ?  ? ? ? ? ? ? ?Anesthesia Physical ?Anesthesia Plan ? ?ASA: 2 ? ?Anesthesia Plan: General  ? ?Post-op Pain Management: Celebrex PO (pre-op)*, Tylenol PO (pre-op)* and Ketamine IV*  ? ?Induction: Intravenous ? ?PONV Risk Score and Plan: 3 and Ondansetron, Dexamethasone, Midazolam and Treatment may vary due to age or medical condition ? ?Airway Management Planned: Mask and Oral ETT ? ?Additional Equipment: None ? ?Intra-op Plan:  ? ?Post-operative Plan: Extubation in OR ? ?Informed Consent: I have reviewed the patients History and Physical, chart, labs and discussed the procedure including the risks, benefits and alternatives for the proposed anesthesia with the patient or authorized representative who has indicated his/her understanding and acceptance.  ? ? ? ?Dental advisory given ? ?Plan Discussed with: CRNA ? ?Anesthesia Plan Comments:   ? ? ? ? ? ?Anesthesia Quick Evaluation ? ?

## 2022-03-12 NOTE — Progress Notes (Signed)
TWO VISITORS ARE ALLOWED TO COME WITH YOU AND STAY IN THE SURGICAL WAITING ROOM ONLY DURING PRE OP AND PROCEDURE DAY OF SURGERY.  ? ?Two VISITORS MAY VISIT WITH YOU AFTER SURGERY IN YOUR PRIVATE ROOM DURING VISITING HOURS ONLY! ? ?PCP - None ?Cardiologist - n/a ? ?Chest x-ray - n/a ?EKG - n/a ?Stress Test - n/a ?ECHO - n/a ?Cardiac Cath - n/a ? ?ICD Pacemaker/Loop - n/a ? ?Sleep Study -  n/a ?CPAP - none ? ?STOP now taking any Aspirin (unless otherwise instructed by your surgeon), Aleve, Naproxen, Ibuprofen, Motrin, Advil, Goody's, BC's, all herbal medications, fish oil, and all vitamins.  ? ?Coronavirus Screening ?Do you have any of the following symptoms:  ?Cough yes/no: No ?Fever (>100.56F)  yes/no: No ?Runny nose yes/no: No ?Sore throat yes/no: No ?Difficulty breathing/shortness of breath  yes/no: No ? ?Have you traveled in the last 14 days and where? yes/no: No ? ?Patient verbalized understanding of instructions that were given via phone. ?

## 2022-03-13 ENCOUNTER — Other Ambulatory Visit: Payer: Self-pay

## 2022-03-13 ENCOUNTER — Inpatient Hospital Stay (HOSPITAL_COMMUNITY)
Admission: AD | Admit: 2022-03-13 | Discharge: 2022-03-16 | DRG: 472 | Disposition: A | Discharge status: Rehabilitation Facility | Payer: Managed Care, Other (non HMO) | Attending: Neurological Surgery | Admitting: Neurological Surgery

## 2022-03-13 ENCOUNTER — Ambulatory Visit (HOSPITAL_COMMUNITY): Payer: Managed Care, Other (non HMO)

## 2022-03-13 ENCOUNTER — Ambulatory Visit (HOSPITAL_BASED_OUTPATIENT_CLINIC_OR_DEPARTMENT_OTHER): Payer: Managed Care, Other (non HMO) | Admitting: Anesthesiology

## 2022-03-13 ENCOUNTER — Ambulatory Visit (HOSPITAL_COMMUNITY): Payer: Managed Care, Other (non HMO) | Admitting: Anesthesiology

## 2022-03-13 ENCOUNTER — Encounter (HOSPITAL_COMMUNITY): Payer: Self-pay | Admitting: Neurological Surgery

## 2022-03-13 ENCOUNTER — Encounter (HOSPITAL_COMMUNITY)
Admission: AD | Disposition: A | Discharge status: Rehabilitation Facility | Payer: Self-pay | Source: Home / Self Care | Attending: Neurological Surgery

## 2022-03-13 DIAGNOSIS — S13161A Dislocation of C5/C6 cervical vertebrae, initial encounter: Secondary | ICD-10-CM | POA: Diagnosis present

## 2022-03-13 DIAGNOSIS — M4802 Spinal stenosis, cervical region: Secondary | ICD-10-CM | POA: Diagnosis present

## 2022-03-13 DIAGNOSIS — S14154A Other incomplete lesion at C4 level of cervical spinal cord, initial encounter: Secondary | ICD-10-CM | POA: Diagnosis present

## 2022-03-13 DIAGNOSIS — G959 Disease of spinal cord, unspecified: Secondary | ICD-10-CM

## 2022-03-13 DIAGNOSIS — Z87891 Personal history of nicotine dependence: Secondary | ICD-10-CM

## 2022-03-13 DIAGNOSIS — Z8249 Family history of ischemic heart disease and other diseases of the circulatory system: Secondary | ICD-10-CM

## 2022-03-13 DIAGNOSIS — W19XXXA Unspecified fall, initial encounter: Secondary | ICD-10-CM | POA: Diagnosis present

## 2022-03-13 DIAGNOSIS — M2578 Osteophyte, vertebrae: Secondary | ICD-10-CM | POA: Diagnosis present

## 2022-03-13 DIAGNOSIS — M50022 Cervical disc disorder at C5-C6 level with myelopathy: Secondary | ICD-10-CM | POA: Diagnosis present

## 2022-03-13 DIAGNOSIS — M50021 Cervical disc disorder at C4-C5 level with myelopathy: Secondary | ICD-10-CM | POA: Diagnosis present

## 2022-03-13 DIAGNOSIS — S13151A Dislocation of C4/C5 cervical vertebrae, initial encounter: Principal | ICD-10-CM | POA: Diagnosis present

## 2022-03-13 DIAGNOSIS — Z6838 Body mass index (BMI) 38.0-38.9, adult: Secondary | ICD-10-CM

## 2022-03-13 DIAGNOSIS — M4712 Other spondylosis with myelopathy, cervical region: Secondary | ICD-10-CM | POA: Diagnosis present

## 2022-03-13 HISTORY — DX: Anxiety disorder, unspecified: F41.9

## 2022-03-13 HISTORY — PX: ANTERIOR CERVICAL DECOMP/DISCECTOMY FUSION: SHX1161

## 2022-03-13 HISTORY — DX: Attention-deficit hyperactivity disorder, unspecified type: F90.9

## 2022-03-13 LAB — CBC
HCT: 39.5 % (ref 36.0–46.0)
Hemoglobin: 13 g/dL (ref 12.0–15.0)
MCH: 28.8 pg (ref 26.0–34.0)
MCHC: 32.9 g/dL (ref 30.0–36.0)
MCV: 87.6 fL (ref 80.0–100.0)
Platelets: 373 10*3/uL (ref 150–400)
RBC: 4.51 MIL/uL (ref 3.87–5.11)
RDW: 13.7 % (ref 11.5–15.5)
WBC: 7.7 10*3/uL (ref 4.0–10.5)
nRBC: 0 % (ref 0.0–0.2)

## 2022-03-13 LAB — SURGICAL PCR SCREEN
MRSA, PCR: NEGATIVE
Staphylococcus aureus: POSITIVE — AB

## 2022-03-13 LAB — POCT PREGNANCY, URINE: Preg Test, Ur: NEGATIVE

## 2022-03-13 IMAGING — RF DG CERVICAL SPINE 2 OR 3 VIEWS
1 series · 2 of 2 positions shown · non-contrast
Comparison: None Available.

CLINICAL DATA: Intraoperative

EXAM:
CERVICAL SPINE - 2-3 VIEW

[Series 1: run · 2 of 2 slices shown]
[im 1/2]
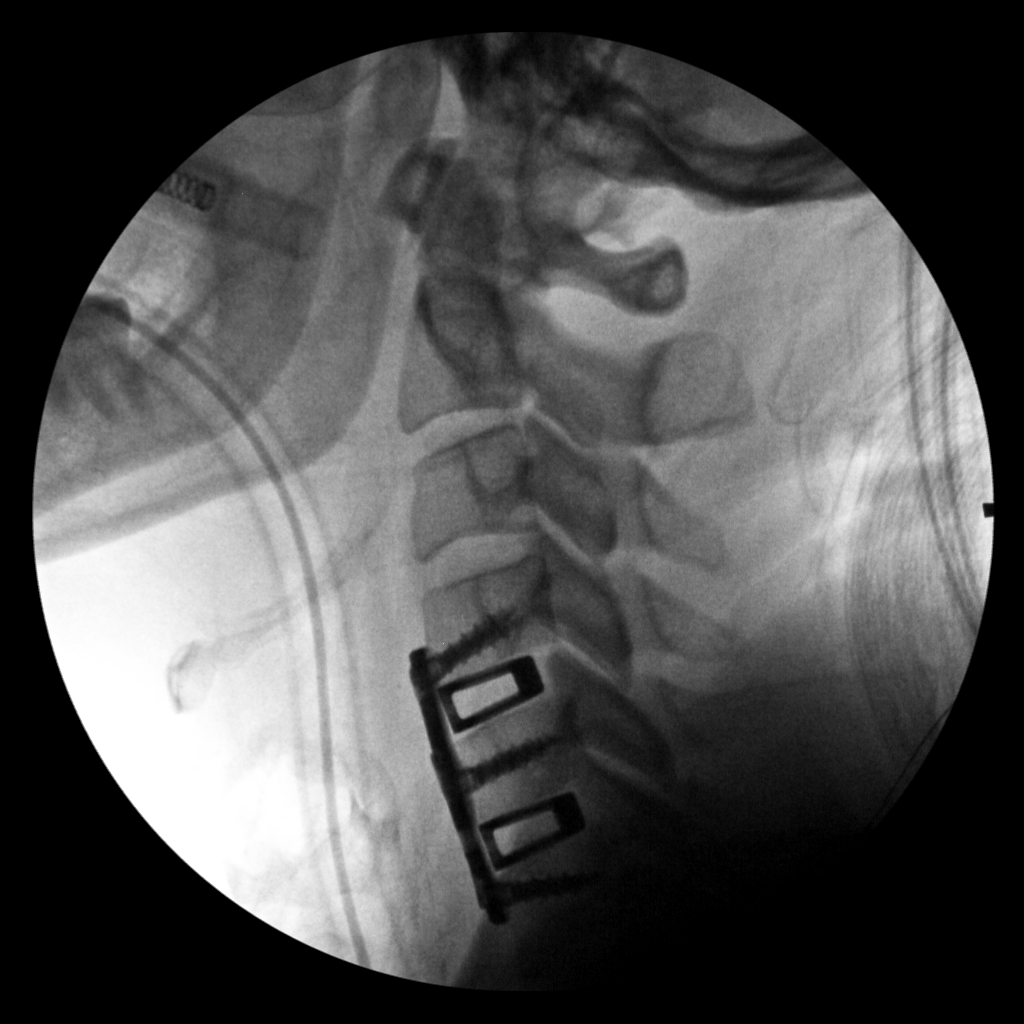
[im 2/2]
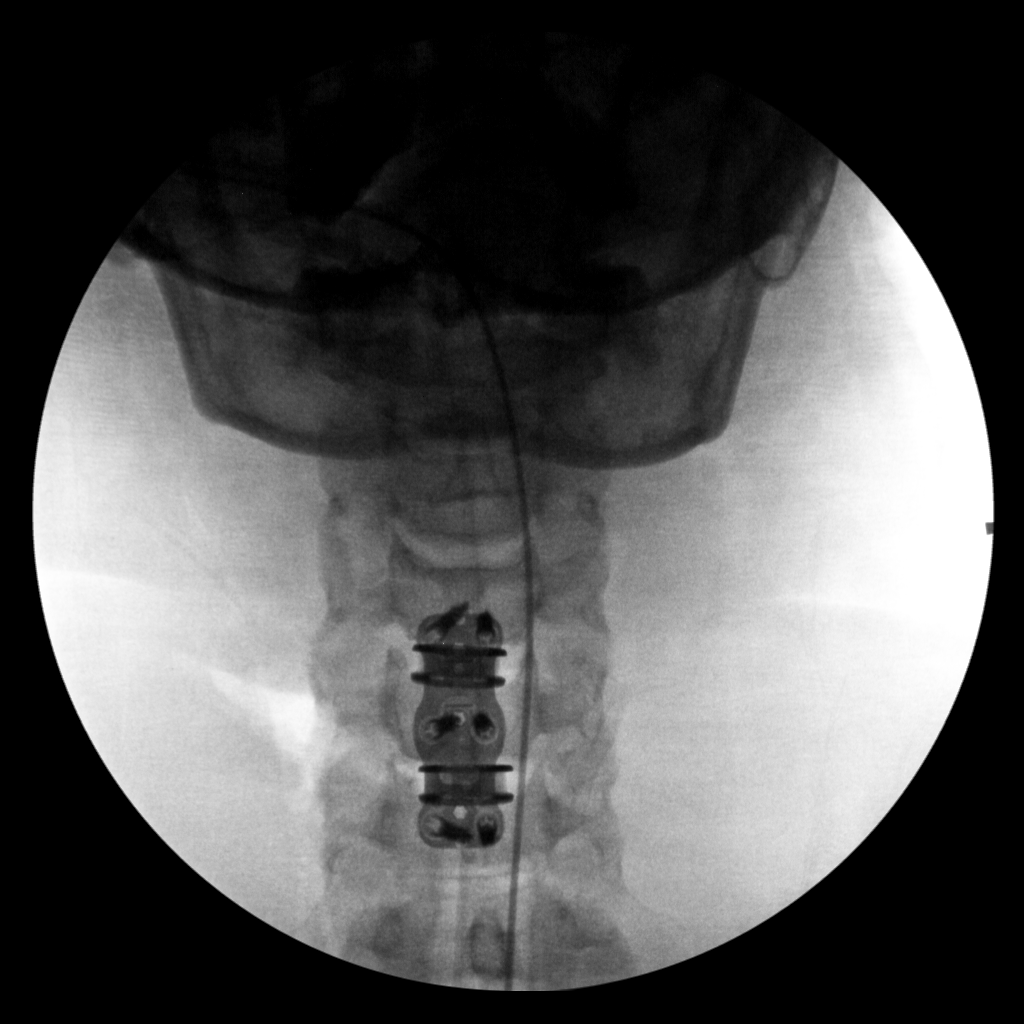

[2 of 2 positions shown; findings below may reference images not displayed]

FINDINGS: Intraoperative images during cervical spine ACDF at C4-C5 and C5-C6.
No evidence of immediate complication.
IMPRESSION: Intraoperative images during C4-C5 and C5-C6 ACDF. No evidence of
immediate complication.

## 2022-03-13 SURGERY — ANTERIOR CERVICAL DECOMPRESSION/DISCECTOMY FUSION 2 LEVELS
Anesthesia: General | Site: Neck

## 2022-03-13 MED ORDER — KETOROLAC TROMETHAMINE 15 MG/ML IJ SOLN
15.0000 mg | Freq: Four times a day (QID) | INTRAMUSCULAR | Status: AC
  Administered 2022-03-13 – 2022-03-14 (×4): 15 mg via INTRAVENOUS
  Filled 2022-03-13 (×4): qty 1

## 2022-03-13 MED ORDER — PROPOFOL 500 MG/50ML IV EMUL
INTRAVENOUS | Status: DC | PRN
  Administered 2022-03-13: 100 ug/kg/min via INTRAVENOUS
  Administered 2022-03-13 (×2): 120 ug/kg/min via INTRAVENOUS

## 2022-03-13 MED ORDER — ORAL CARE MOUTH RINSE: 15.0000 mL | Freq: Once | OROMUCOSAL | Status: AC

## 2022-03-13 MED ORDER — DEXAMETHASONE SODIUM PHOSPHATE 10 MG/ML IJ SOLN
INTRAMUSCULAR | Status: DC | PRN
Start: 2022-03-13 — End: 2022-03-13
  Administered 2022-03-13: 10 mg via INTRAVENOUS

## 2022-03-13 MED ORDER — OXYCODONE HCL 5 MG PO TABS
10.0000 mg | ORAL_TABLET | ORAL | Status: DC | PRN
  Administered 2022-03-13 – 2022-03-16 (×12): 10 mg via ORAL
  Filled 2022-03-13 (×12): qty 2

## 2022-03-13 MED ORDER — SODIUM CHLORIDE 0.9 % IV SOLN
250.0000 mL | INTRAVENOUS | Status: DC
  Administered 2022-03-13: 250 mL via INTRAVENOUS

## 2022-03-13 MED ORDER — 0.9 % SODIUM CHLORIDE (POUR BTL) OPTIME
TOPICAL | Status: DC | PRN
  Administered 2022-03-13: 1000 mL

## 2022-03-13 MED ORDER — HYDROMORPHONE HCL 1 MG/ML IJ SOLN: 0.5000 mg | INTRAMUSCULAR | Status: DC | PRN

## 2022-03-13 MED ORDER — DEXMEDETOMIDINE (PRECEDEX) IN NS 20 MCG/5ML (4 MCG/ML) IV SYRINGE
PREFILLED_SYRINGE | INTRAVENOUS | Status: AC
  Filled 2022-03-13: qty 5

## 2022-03-13 MED ORDER — CHLORHEXIDINE GLUCONATE 0.12 % MT SOLN: 15.0000 mL | Freq: Once | OROMUCOSAL | Status: AC

## 2022-03-13 MED ORDER — ACETAMINOPHEN 500 MG PO TABS: 1000.0000 mg | ORAL_TABLET | Freq: Once | ORAL | Status: AC

## 2022-03-13 MED ORDER — SODIUM CHLORIDE 0.9% FLUSH: 3.0000 mL | INTRAVENOUS | Status: DC | PRN

## 2022-03-13 MED ORDER — ONDANSETRON HCL 4 MG PO TABS: 4.0000 mg | ORAL_TABLET | Freq: Four times a day (QID) | ORAL | Status: DC | PRN

## 2022-03-13 MED ORDER — ONDANSETRON HCL 4 MG/2ML IJ SOLN: 4.0000 mg | Freq: Four times a day (QID) | INTRAMUSCULAR | Status: DC | PRN

## 2022-03-13 MED ORDER — FENTANYL CITRATE (PF) 250 MCG/5ML IJ SOLN
INTRAMUSCULAR | Status: DC | PRN
  Administered 2022-03-13: 100 ug via INTRAVENOUS
  Administered 2022-03-13 (×2): 50 ug via INTRAVENOUS

## 2022-03-13 MED ORDER — MIDAZOLAM HCL 2 MG/2ML IJ SOLN
INTRAMUSCULAR | Status: DC | PRN
Start: 2022-03-13 — End: 2022-03-13
  Administered 2022-03-13: 2 mg via INTRAVENOUS

## 2022-03-13 MED ORDER — LIDOCAINE 2% (20 MG/ML) 5 ML SYRINGE
INTRAMUSCULAR | Status: DC | PRN
Start: 2022-03-13 — End: 2022-03-13
  Administered 2022-03-13: 60 mg via INTRAVENOUS

## 2022-03-13 MED ORDER — CEFAZOLIN SODIUM-DEXTROSE 2-4 GM/100ML-% IV SOLN
INTRAVENOUS | Status: AC
  Administered 2022-03-13: 2 g via INTRAVENOUS
  Filled 2022-03-13: qty 100

## 2022-03-13 MED ORDER — MIDAZOLAM HCL 2 MG/2ML IJ SOLN
INTRAMUSCULAR | Status: AC
  Filled 2022-03-13: qty 2

## 2022-03-13 MED ORDER — CEFAZOLIN SODIUM-DEXTROSE 2-4 GM/100ML-% IV SOLN
2.0000 g | Freq: Three times a day (TID) | INTRAVENOUS | Status: AC
  Administered 2022-03-14: 2 g via INTRAVENOUS
  Filled 2022-03-13 (×3): qty 100

## 2022-03-13 MED ORDER — ACETAMINOPHEN 325 MG PO TABS
650.0000 mg | ORAL_TABLET | ORAL | Status: DC | PRN
  Administered 2022-03-16: 650 mg via ORAL
  Filled 2022-03-13: qty 2

## 2022-03-13 MED ORDER — THROMBIN 5000 UNITS EX SOLR
CUTANEOUS | Status: AC
  Filled 2022-03-13: qty 5000

## 2022-03-13 MED ORDER — CHLORHEXIDINE GLUCONATE 0.12 % MT SOLN
OROMUCOSAL | Status: AC
  Administered 2022-03-13: 15 mL via OROMUCOSAL
  Filled 2022-03-13: qty 15

## 2022-03-13 MED ORDER — ONDANSETRON HCL 4 MG/2ML IJ SOLN
INTRAMUSCULAR | Status: DC | PRN
  Administered 2022-03-13: 4 mg via INTRAVENOUS

## 2022-03-13 MED ORDER — FENTANYL CITRATE (PF) 250 MCG/5ML IJ SOLN
INTRAMUSCULAR | Status: AC
  Filled 2022-03-13: qty 5

## 2022-03-13 MED ORDER — ONDANSETRON HCL 4 MG/2ML IJ SOLN
INTRAMUSCULAR | Status: AC
  Filled 2022-03-13: qty 2

## 2022-03-13 MED ORDER — ACETAMINOPHEN 500 MG PO TABS
ORAL_TABLET | ORAL | Status: AC
  Administered 2022-03-13: 1000 mg via ORAL
  Filled 2022-03-13: qty 2

## 2022-03-13 MED ORDER — MUPIROCIN 2 % EX OINT
1.0000 "application " | TOPICAL_OINTMENT | Freq: Two times a day (BID) | CUTANEOUS | Status: DC
  Administered 2022-03-14 – 2022-03-16 (×4): 1 via NASAL
  Filled 2022-03-13 (×2): qty 22

## 2022-03-13 MED ORDER — PHENYLEPHRINE HCL-NACL 20-0.9 MG/250ML-% IV SOLN
INTRAVENOUS | Status: DC | PRN
  Administered 2022-03-13: 10 ug/min via INTRAVENOUS

## 2022-03-13 MED ORDER — DEXAMETHASONE SODIUM PHOSPHATE 10 MG/ML IJ SOLN
INTRAMUSCULAR | Status: AC
  Filled 2022-03-13: qty 1

## 2022-03-13 MED ORDER — FENTANYL CITRATE (PF) 100 MCG/2ML IJ SOLN
INTRAMUSCULAR | Status: AC
  Filled 2022-03-13: qty 2

## 2022-03-13 MED ORDER — PROPOFOL 1000 MG/100ML IV EMUL
INTRAVENOUS | Status: AC
  Filled 2022-03-13: qty 100

## 2022-03-13 MED ORDER — SUGAMMADEX SODIUM 200 MG/2ML IV SOLN
INTRAVENOUS | Status: DC | PRN
  Administered 2022-03-13: 200 mg via INTRAVENOUS

## 2022-03-13 MED ORDER — PROPOFOL 10 MG/ML IV BOLUS
INTRAVENOUS | Status: AC
  Filled 2022-03-13: qty 20

## 2022-03-13 MED ORDER — FENTANYL CITRATE (PF) 100 MCG/2ML IJ SOLN
25.0000 ug | INTRAMUSCULAR | Status: DC | PRN
  Administered 2022-03-13 (×2): 25 ug via INTRAVENOUS
  Administered 2022-03-13 (×3): 50 ug via INTRAVENOUS

## 2022-03-13 MED ORDER — TRIAMCINOLONE ACETONIDE 40 MG/ML IJ SUSP
INTRAMUSCULAR | Status: AC
  Filled 2022-03-13: qty 5

## 2022-03-13 MED ORDER — CHLORHEXIDINE GLUCONATE CLOTH 2 % EX PADS
6.0000 | MEDICATED_PAD | Freq: Every day | CUTANEOUS | Status: DC
  Administered 2022-03-14 – 2022-03-16 (×3): 6 via TOPICAL

## 2022-03-13 MED ORDER — KETAMINE HCL 50 MG/5ML IJ SOSY
PREFILLED_SYRINGE | INTRAMUSCULAR | Status: AC
  Filled 2022-03-13: qty 5

## 2022-03-13 MED ORDER — DOCUSATE SODIUM 100 MG PO CAPS
100.0000 mg | ORAL_CAPSULE | Freq: Two times a day (BID) | ORAL | Status: DC
  Administered 2022-03-13 – 2022-03-16 (×6): 100 mg via ORAL
  Filled 2022-03-13 (×6): qty 1

## 2022-03-13 MED ORDER — ROCURONIUM BROMIDE 10 MG/ML (PF) SYRINGE
PREFILLED_SYRINGE | INTRAVENOUS | Status: AC
  Filled 2022-03-13: qty 10

## 2022-03-13 MED ORDER — THROMBIN 5000 UNITS EX SOLR: OROMUCOSAL | Status: DC | PRN

## 2022-03-13 MED ORDER — PHENOL 1.4 % MT LIQD: 1.0000 | OROMUCOSAL | Status: DC | PRN

## 2022-03-13 MED ORDER — PHENYLEPHRINE 80 MCG/ML (10ML) SYRINGE FOR IV PUSH (FOR BLOOD PRESSURE SUPPORT): PREFILLED_SYRINGE | INTRAVENOUS | Status: DC | PRN

## 2022-03-13 MED ORDER — MENTHOL 3 MG MT LOZG
1.0000 | LOZENGE | OROMUCOSAL | Status: DC | PRN
  Filled 2022-03-13: qty 9

## 2022-03-13 MED ORDER — CEFAZOLIN SODIUM-DEXTROSE 2-4 GM/100ML-% IV SOLN
2.0000 g | Freq: Once | INTRAVENOUS | Status: AC
  Administered 2022-03-13: 2 g via INTRAVENOUS

## 2022-03-13 MED ORDER — CELECOXIB 200 MG PO CAPS: 200.0000 mg | ORAL_CAPSULE | Freq: Once | ORAL | Status: AC

## 2022-03-13 MED ORDER — HYDROMORPHONE HCL 1 MG/ML IJ SOLN
INTRAMUSCULAR | Status: AC
  Filled 2022-03-13: qty 1

## 2022-03-13 MED ORDER — OXYCODONE HCL 5 MG PO TABS
ORAL_TABLET | ORAL | Status: AC
  Filled 2022-03-13: qty 1

## 2022-03-13 MED ORDER — KETAMINE HCL 10 MG/ML IJ SOLN
INTRAMUSCULAR | Status: DC | PRN
  Administered 2022-03-13: 30 mg via INTRAVENOUS
  Administered 2022-03-13: 10 mg via INTRAVENOUS

## 2022-03-13 MED ORDER — ALPRAZOLAM 0.5 MG PO TABS: 0.5000 mg | ORAL_TABLET | Freq: Every evening | ORAL | Status: DC | PRN

## 2022-03-13 MED ORDER — SODIUM CHLORIDE 0.9% FLUSH
3.0000 mL | Freq: Two times a day (BID) | INTRAVENOUS | Status: DC
  Administered 2022-03-14 (×2): 3 mL via INTRAVENOUS

## 2022-03-13 MED ORDER — HYDROMORPHONE HCL 1 MG/ML IJ SOLN
0.2500 mg | INTRAMUSCULAR | Status: DC | PRN
  Administered 2022-03-13: 0.5 mg via INTRAVENOUS

## 2022-03-13 MED ORDER — LACTATED RINGERS IV SOLN: INTRAVENOUS | Status: DC

## 2022-03-13 MED ORDER — LACTATED RINGERS IV SOLN: INTRAVENOUS | Status: DC | PRN

## 2022-03-13 MED ORDER — CELECOXIB 200 MG PO CAPS
ORAL_CAPSULE | ORAL | Status: AC
  Administered 2022-03-13: 200 mg via ORAL
  Filled 2022-03-13: qty 1

## 2022-03-13 MED ORDER — METHOCARBAMOL 500 MG PO TABS
500.0000 mg | ORAL_TABLET | Freq: Four times a day (QID) | ORAL | Status: DC | PRN
  Administered 2022-03-14 – 2022-03-16 (×6): 500 mg via ORAL
  Filled 2022-03-13 (×6): qty 1

## 2022-03-13 MED ORDER — LIDOCAINE 2% (20 MG/ML) 5 ML SYRINGE
INTRAMUSCULAR | Status: AC
  Filled 2022-03-13: qty 5

## 2022-03-13 MED ORDER — THROMBIN 5000 UNITS EX SOLR
CUTANEOUS | Status: AC
  Filled 2022-03-13: qty 10000

## 2022-03-13 MED ORDER — OXYCODONE HCL 5 MG PO TABS
5.0000 mg | ORAL_TABLET | ORAL | Status: DC | PRN
  Administered 2022-03-13 – 2022-03-14 (×3): 5 mg via ORAL
  Filled 2022-03-13 (×2): qty 1

## 2022-03-13 MED ORDER — PROPOFOL 10 MG/ML IV BOLUS
INTRAVENOUS | Status: DC | PRN
  Administered 2022-03-13: 160 mg via INTRAVENOUS

## 2022-03-13 MED ORDER — ACETAMINOPHEN 650 MG RE SUPP: 650.0000 mg | RECTAL | Status: DC | PRN

## 2022-03-13 MED ORDER — ROCURONIUM BROMIDE 10 MG/ML (PF) SYRINGE
PREFILLED_SYRINGE | INTRAVENOUS | Status: DC | PRN
  Administered 2022-03-13: 60 mg via INTRAVENOUS
  Administered 2022-03-13: 40 mg via INTRAVENOUS
  Administered 2022-03-13: 20 mg via INTRAVENOUS

## 2022-03-13 MED ORDER — DEXMEDETOMIDINE (PRECEDEX) IN NS 20 MCG/5ML (4 MCG/ML) IV SYRINGE
PREFILLED_SYRINGE | INTRAVENOUS | Status: DC | PRN
  Administered 2022-03-13: 4 ug via INTRAVENOUS

## 2022-03-13 MED ORDER — METHOCARBAMOL 1000 MG/10ML IJ SOLN
500.0000 mg | Freq: Four times a day (QID) | INTRAVENOUS | Status: DC | PRN
  Filled 2022-03-13: qty 5

## 2022-03-13 SURGICAL SUPPLY — 71 items
APL SKNCLS STERI-STRIP NONHPOA (GAUZE/BANDAGES/DRESSINGS) ×1
BAG COUNTER SPONGE SURGICOUNT (BAG) ×2 IMPLANT
BAG SPNG CNTER NS LX DISP (BAG) ×1
BAND INSRT 18 STRL LF DISP RB (MISCELLANEOUS) ×2
BAND RUBBER #18 3X1/16 STRL (MISCELLANEOUS) ×4 IMPLANT
BENZOIN TINCTURE PRP APPL 2/3 (GAUZE/BANDAGES/DRESSINGS) ×1 IMPLANT
BIT DRILL NEURO 2X3.1 SFT TUCH (MISCELLANEOUS) ×1 IMPLANT
BLADE CLIPPER SURG (BLADE) IMPLANT
BUR CARBIDE MATCH 3.0 (BURR) ×2 IMPLANT
CANISTER SUCT 3000ML PPV (MISCELLANEOUS) ×2 IMPLANT
CARTRIDGE OIL MAESTRO DRILL (MISCELLANEOUS) ×1 IMPLANT
COVER MAYO STAND STRL (DRAPES) ×4 IMPLANT
DEVICE ENDSKLTN IMPL 16X14X7X6 (Cage) IMPLANT
DIFFUSER DRILL AIR PNEUMATIC (MISCELLANEOUS) ×2 IMPLANT
DRAPE C-ARM 42X72 X-RAY (DRAPES) ×2 IMPLANT
DRAPE HALF SHEET 40X57 (DRAPES) IMPLANT
DRAPE LAPAROTOMY 100X72X124 (DRAPES) ×2 IMPLANT
DRAPE MICROSCOPE LEICA (MISCELLANEOUS) ×2 IMPLANT
DRILL NEURO 2X3.1 SOFT TOUCH (MISCELLANEOUS) ×2
DRSG OPSITE POSTOP 3X4 (GAUZE/BANDAGES/DRESSINGS) ×1 IMPLANT
DURAPREP 6ML APPLICATOR 50/CS (WOUND CARE) ×2 IMPLANT
ELECT BLADE INSULATED 4IN (ELECTROSURGICAL) ×2
ELECT COATED BLADE 2.86 ST (ELECTRODE) ×2 IMPLANT
ELECT REM PT RETURN 9FT ADLT (ELECTROSURGICAL) ×2
ELECTRODE BLADE INSULATED 4IN (ELECTROSURGICAL) IMPLANT
ELECTRODE REM PT RTRN 9FT ADLT (ELECTROSURGICAL) ×1 IMPLANT
ENDOSKELETON IMPLANT 16X14X7X6 (Cage) ×4 IMPLANT
EVACUATOR 1/8 PVC DRAIN (DRAIN) IMPLANT
FEE INTRAOP CADWELL SUPPLY NCS (MISCELLANEOUS) IMPLANT
FEE INTRAOP MONITOR IMPULS NCS (MISCELLANEOUS) IMPLANT
GAUZE 4X4 16PLY ~~LOC~~+RFID DBL (SPONGE) IMPLANT
GLOVE BIOGEL PI IND STRL 8 (GLOVE) ×1 IMPLANT
GLOVE BIOGEL PI INDICATOR 8 (GLOVE) ×1
GLOVE ECLIPSE 8.0 STRL XLNG CF (GLOVE) ×4 IMPLANT
GLOVE EXAM NITRILE LRG STRL (GLOVE) IMPLANT
GLOVE EXAM NITRILE XL STR (GLOVE) IMPLANT
GLOVE EXAM NITRILE XS STR PU (GLOVE) IMPLANT
GLOVE SURG ENC MOIS LTX SZ8 (GLOVE) ×2 IMPLANT
GLOVE SURG UNDER POLY LF SZ8.5 (GLOVE) ×2 IMPLANT
GOWN STRL REUS W/ TWL LRG LVL3 (GOWN DISPOSABLE) IMPLANT
GOWN STRL REUS W/ TWL XL LVL3 (GOWN DISPOSABLE) ×2 IMPLANT
GOWN STRL REUS W/TWL 2XL LVL3 (GOWN DISPOSABLE) IMPLANT
GOWN STRL REUS W/TWL LRG LVL3 (GOWN DISPOSABLE) ×6
GOWN STRL REUS W/TWL XL LVL3 (GOWN DISPOSABLE) ×6
HEMOSTAT POWDER KIT SURGIFOAM (HEMOSTASIS) ×2 IMPLANT
INTRAOP CADWELL SUPPLY FEE NCS (MISCELLANEOUS) ×1
INTRAOP DISP SUPPLY FEE NCS (MISCELLANEOUS) ×2
INTRAOP MONITOR FEE IMPULS NCS (MISCELLANEOUS) ×1
INTRAOP MONITOR FEE IMPULSE (MISCELLANEOUS) ×2
KIT BASIN OR (CUSTOM PROCEDURE TRAY) ×2 IMPLANT
KIT TURNOVER KIT B (KITS) ×2 IMPLANT
NDL SPNL 18GX3.5 QUINCKE PK (NEEDLE) ×1 IMPLANT
NEEDLE HYPO 22GX1.5 SAFETY (NEEDLE) ×2 IMPLANT
NEEDLE SPNL 18GX3.5 QUINCKE PK (NEEDLE) ×2 IMPLANT
NS IRRIG 1000ML POUR BTL (IV SOLUTION) ×2 IMPLANT
OIL CARTRIDGE MAESTRO DRILL (MISCELLANEOUS) ×2
PACK LAMINECTOMY NEURO (CUSTOM PROCEDURE TRAY) ×2 IMPLANT
PAD ARMBOARD 7.5X6 YLW CONV (MISCELLANEOUS) ×6 IMPLANT
PIN DISTRACTION 14MM (PIN) ×4 IMPLANT
PLATE 39MM (Plate) ×1 IMPLANT
PUTTY DBF 1CC CORTICAL FIBERS (Putty) ×1 IMPLANT
SCREW VA SD 3.5X16 (Screw) ×6 IMPLANT
SPONGE INTESTINAL PEANUT (DISPOSABLE) ×3 IMPLANT
SPONGE SURGIFOAM ABS GEL SZ50 (HEMOSTASIS) ×2 IMPLANT
STAPLER VISISTAT 35W (STAPLE) ×1 IMPLANT
STRIP CLOSURE SKIN 1/2X4 (GAUZE/BANDAGES/DRESSINGS) ×2 IMPLANT
TAPE SURG TRANSPORE 1 IN (GAUZE/BANDAGES/DRESSINGS) ×1 IMPLANT
TAPE SURGICAL TRANSPORE 1 IN (GAUZE/BANDAGES/DRESSINGS) ×2
TOWEL GREEN STERILE (TOWEL DISPOSABLE) ×2 IMPLANT
TOWEL GREEN STERILE FF (TOWEL DISPOSABLE) ×2 IMPLANT
WATER STERILE IRR 1000ML POUR (IV SOLUTION) ×2 IMPLANT

## 2022-03-13 NOTE — Anesthesia Procedure Notes (Addendum)
Procedure Name: Intubation ?Date/Time: 03/13/2022 7:56 AM ?Performed by: Lorie Phenix, CRNA ?Pre-anesthesia Checklist: Patient identified, Emergency Drugs available, Suction available and Patient being monitored ?Patient Re-evaluated:Patient Re-evaluated prior to induction ?Oxygen Delivery Method: Circle system utilized ?Preoxygenation: Pre-oxygenation with 100% oxygen ?Induction Type: IV induction ?Ventilation: Mask ventilation without difficulty ?Laryngoscope Size: Glidescope and 3 ?Grade View: Grade I ?Tube type: Oral ?Tube size: 7.5 mm ?Number of attempts: 1 ?Airway Equipment and Method: Rigid stylet ?Placement Confirmation: ETT inserted through vocal cords under direct vision, positive ETCO2 and breath sounds checked- equal and bilateral ?Secured at: 22 cm ?Tube secured with: Tape ?Dental Injury: Teeth and Oropharynx as per pre-operative assessment  ?Comments: Maintained neck neutrality during video laryngoscopy.  ? ? ? ? ?

## 2022-03-13 NOTE — Progress Notes (Signed)
Orthopedic Tech Progress Note ?Patient Details:  ?Alexandra Schmitt ?[birth year] ?[id number] ? ?PACU RN called requesting an ASPEN CERVICAL COLLAR, called in order to [facility]  ? ?Patient ID: Alexandra Schmitt, female   DOB: [birth year], 41 y.o.   MRN: [id number] ? ?Alexandra Schmitt ?03/13/2022, 11:48 AM ? ?

## 2022-03-13 NOTE — H&P (Signed)
? ? ?Providing Compassionate, Quality Care - Together ? ?NEUROSURGERY HISTORY & PHYSICAL ? ? ?Alexandra Schmitt is an 41 y.o. female.   ?Chief Complaint: Bilateral upper extremity weakness ?HPI: This is a 41 year old female who presented with complaints of bilateral upper extremity weakness as well as difficulty walking after a fall on April 2.  She also complains of significant numbness and tingling.  She states she did go to her chiropractor after her fall and she was referred to neurosurgery for evaluation as an outpatient.  MRI cervical spine revealed severe stenosis at C4-5 and C5-6 with cord compression due to herniated nucleus pulposus and disc osteophyte complex, with cord signal change at C5-6 therefore I recommended urgent surgical intervention, and she presents today for surgical intervention. ? ?Past Medical History:  ?Diagnosis Date  ? ADHD (attention deficit hyperactivity disorder)   ? Anxiety   ? ? ?Past Surgical History:  ?Procedure Laterality Date  ? BROKEN NOSE    ? RIGHT FOOT RECONSTRUCTION    ? TONSILLECTOMY    ? ? ?Family History  ?Problem Relation Age of Onset  ? Hypertension Father   ? Cancer Maternal Grandmother   ?     LUNG  ? Cancer Maternal Grandfather   ?     LUNG  ? Cancer Paternal Grandfather   ?     LUNG  ? ?Social History:  reports that she has quit smoking. She quit smokeless tobacco use about 7 years ago. She reports current alcohol use. She reports that she does not use drugs. ? ?Allergies: No Known Allergies ? ?Medications Prior to Admission  ?Medication Sig Dispense Refill  ? ALPRAZolam (XANAX) 0.5 MG tablet Take 0.5 mg by mouth at bedtime as needed for sleep or anxiety.    ? Carboxymethylcellulose Sodium (LUBRICANT EYE DROPS OP) Place 1 drop into both eyes daily as needed (dry eyes).    ? ibuprofen (ADVIL) 200 MG tablet Take 400 mg by mouth every 6 (six) hours as needed for headache or moderate pain.    ? ? ?Results for orders placed or performed during the hospital encounter of  03/13/22 (from the past 48 hour(s))  ?CBC per protocol     Status: None  ? Collection Time: 03/13/22  5:59 AM  ?Result Value Ref Range  ? WBC 7.7 4.0 - 10.5 K/uL  ? RBC 4.51 3.87 - 5.11 MIL/uL  ? Hemoglobin 13.0 12.0 - 15.0 g/dL  ? HCT 39.5 36.0 - 46.0 %  ? MCV 87.6 80.0 - 100.0 fL  ? MCH 28.8 26.0 - 34.0 pg  ? MCHC 32.9 30.0 - 36.0 g/dL  ? RDW 13.7 11.5 - 15.5 %  ? Platelets 373 150 - 400 K/uL  ? nRBC 0.0 0.0 - 0.2 %  ?  Comment: Performed at [facility], [address], [place], [place] [address]  ?Pregnancy, urine POC     Status: None  ? Collection Time: 03/13/22  6:57 AM  ?Result Value Ref Range  ? Preg Test, Ur NEGATIVE NEGATIVE  ?  Comment:        ?THE SENSITIVITY OF THIS ?METHODOLOGY IS >24 mIU/mL ?  ? ?No results found. ? ?ROS ?All pertinent positives and negatives are listed in HPI above ? ?Blood pressure 130/70, pulse 97, temperature 98.1 ?F (36.7 ?C), temperature source Oral, resp. rate 18, height 5\' 6"  (1.676 m), weight 108 kg, last menstrual period 03/07/2022, SpO2 98 %. ?Physical Exam  ?Alert oriented x3, no acute distress ?PERRLA ?Cranial  nerves II through XII intact ?Speech fluent and appropriate ?Right upper extremity: Deltoid, Bi, Tri 4/5, grip 2/5, finger extensor 1/5 ?Left upper extremity: Silt, Bi, Tri 4/5, grip 3/5, finger extensor 3/5 ?Bilateral lower extremities 3/5 throughout ?Brisk Hoffmann's bilaterally, clonus in the bilateral lower extremities ? ?Assessment/Plan ?41 year old female with ? 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 ?-OR today for ACDF C4-6.  We discussed all risks, benefits and expected outcomes as well as alternatives to treatment.  Informed consent was obtained. ? ? ?Thank you for allowing me to participate in this patient's care.  Please do not hesitate to call with questions or concerns. ? ? ?Nixon Sparr, DO ?Neurosurgeon ?[facility] ?Cell: [phone] ? ? ? ?

## 2022-03-13 NOTE — Op Note (Signed)
? ?Providing Compassionate, Quality Care - Together ? ?Date of service: 03/13/2022 ? ?PREOP DIAGNOSIS: Cervical spondylosis with myelopathy, severe stenosis C4-5, C5-6 with cord signal change, fall with spinal cord injury ? ?POSTOP DIAGNOSIS: Same ? ?PROCEDURE: ?1. Arthrodesis C4-5, C5-6, anterior interbody technique  ?2. Placement of intervertebral biomechanical device C4-5, C5-6; Medtronic Titan titanium interbody 7 x 16 x 59mm at C4-5, 7 x 16 x 14 mm at C5-6 ?3. Placement of anterior instrumentation consisting of interbody plate and screws -39 mm Medtronic Zevo plate, 16 mm x 3.5 mm screws bilaterally at C4, C5, C6 ?4. Discectomy at C4-5, C5-6 for decompression of spinal cord and exiting nerve roots  ?5. Use of morselized bone allograft  ?6.  Use of autograft, same incision ?7. Use of intraoperative microscope ?8.  Intraoperative use of neuro monitoring, SSEPs, greater than 1 hour ? ?SURGEON: Dr. Kendell Bane Kamilo Och, DO ? ?ASSISTANT: Docia Barrier, NP ? ?ANESTHESIA: General Endotracheal ? ?EBL: 50 cc ? ?SPECIMENS: None ? ?DRAINS: None ? ?COMPLICATIONS: None immediate ? ?CONDITION: Hemodynamically stable to PACU ? ?HISTORY: ?Alexandra Schmitt is a 41 y.o. y.o. female who initially presented to the outpatient clinic with signs and symptoms consistent with spinal cord injury after a fall.  She states she had a fall April 2 and had significant numbness, tingling, weakness throughout all of her extremities and difficulty walking, feeding herself and difficulty with fine motor movement.  I ordered a stat MRI as her physical exam was consistent with myelopathy. MRI demonstrated degenerative disc disease with broad-based disc bulging and severe stenosis at C4-5, and severe stenosis with cord compression, cord signal change and broad-based disc bulging at C5-6. Treatment options were discussed including anterior cervical discectomy C4-6, versus pain control and monitoring.  However given her significant myelopathic symptoms I  recommended surgical intervention. After all questions were answered, informed consent was obtained. ? ?PROCEDURE IN DETAIL: ?The patient was brought to the operating room and transferred to the operative table. After induction of general anesthesia, the patient was positioned on the operative table in the supine position with all pressure points meticulously padded.  Prepositional SSEPs were obtained and noted to be monitorable.  The neck was gently extended and post positional SSEPs were noted to be stable.  The skin of the neck was then prepped and draped in the usual sterile fashion.  Physician driven timeout was performed. ? ?After timeout was conducted, skin incision was then made sharply with a 10 blade and Bovie electrocautery was used to dissect the subcutaneous tissue until the platysma was identified. The platysma was then divided and undermined. The sternocleidomastoid muscle was then identified and, utilizing natural fascial planes in the neck, the prevertebral fascia was identified and the carotid sheath was retracted laterally and the trachea and esophagus retracted medially. Again using fluoroscopy, the correct disc space was identified. Bovie electrocautery was used to dissect in the subperiosteal plane and elevate the bilateral longus coli muscles. Self-retaining retractors were then placed under the longus coli muscles bilaterally. At this point, the microscope was draped and brought into the field, and the remainder of the case was done under the microscope using microdissecting technique. ? ?ACDF C4-5: Distraction pins were placed in midline above and below the disc space.  The disc  space was placed in distraction.  The disc space was incised sharply and rongeurs were use to initially complete a discectomy. The high-speed drill was then used to complete discectomy until the posterior annulus was identified and removed and the  posterior longitudinal ligament was identified. Using microcurettes,  the PLL was elevated, and Kerrison rongeurs were used to remove the posterior longitudinal ligament and the ventral thecal sac was identified. Using a combination of curettes and ronguers, complete decompression of the thecal sac and exiting nerve roots at this level was completed, and verified using micro-nerve hook.  There was significant disc osteophyte complex causing severe stenosis at this level.  The disc space was taken out of distraction.  Epidural hemostasis was achieved with Surgifoam. ? ?Having completed our decompression, attention was turned to placement of the intervertebral device. Trial spacers were used to select a 7 mm graft. This graft was then filled with morcellized allograft, and inserted under live fluoroscopy. ? ?ACDF C5-6: Distraction pins were placed in midline above and below the disc space.  The disc  space was placed in distraction.  The disc space was incised sharply and rongeurs were use to initially complete a discectomy. The high-speed drill was then used to complete discectomy until the posterior annulus was identified and removed and the posterior longitudinal ligament was identified. Using microcurettes, the PLL was elevated, and Kerrison rongeurs were used to remove the posterior longitudinal ligament and the ventral thecal sac was identified. Using a combination of curettes and ronguers, complete decompression of the thecal sac and exiting nerve roots at this level was completed, and verified using micro-nerve hook.  There was significant disc osteophyte complex causing severe stenosis at this level as well as multiple disc fragments herniated inferiorly that were removed with microcurette's.  The disc space was taken out of distraction.  Epidural hemostasis was achieved with Surgifoam. ? ?Having completed our decompression, attention was turned to placement of the intervertebral device. Trial spacers were used to select a 7 mm graft. This graft was then filled with morcellized  allograft, and inserted under live fluoroscopy. ? ?After placement of the intervertebral device, the above anterior cervical plate was selected, and placed across the interspaces. Using a high-speed drill, the cortex of the cervical vertebral bodies was punctured, and screws inserted bilaterally at C4, C5, C6 with appropriate bony purchase. Final fluoroscopic images in AP and lateral projections were taken to confirm good hardware placement.  The plate was final tightened to the manufacturer's recommendation and the screws were locked in place. ? ?At this point, after all counts were verified to be correct, meticulous hemostasis was secured using a combination of bipolar electrocautery and passive hemostatics.  Skin was closed with staples.  Sterile dressing was applied.  Throughout the entirety of the case, there is no significant change in neuro monitoring, at the end of the case the neuro monitoring technician believe that there is slight improvement and monitoring of SSEPs. ? ?The patient tolerated the procedure well and was extubated in the room and taken to the postanesthesia care unit in stable condition.  ?

## 2022-03-13 NOTE — Progress Notes (Signed)
Pt is on hold for a room assignment on 3W. Patient does not have orders for continuous monitoring-- pt placed in [place] 16 with sister until room assignment is given. Continuing to monitor- pt in NAD. Call bell at bedside. ?

## 2022-03-13 NOTE — Transfer of Care (Signed)
Immediate Anesthesia Transfer of Care Note ? ?Patient: Alexandra Schmitt ? ?Procedure(s) Performed: Cervical four-five, Cervical five-six Anterior Cervical Decompression Discectomy Fusion (Neck) ? ?Patient Location: PACU ? ?Anesthesia Type:General ? ?Level of Consciousness: awake and drowsy ? ?Airway & Oxygen Therapy: Patient Spontanous Breathing and Patient connected to face mask oxygen ? ?Post-op Assessment: Report given to RN and Post -op Vital signs reviewed and stable ? ?Post vital signs: Reviewed and stable ? ?Last Vitals:  ?Vitals Value Taken Time  ?BP 142/72 03/13/22 1056  ?Temp 36.9 ?C 03/13/22 1056  ?Pulse 97 03/13/22 1101  ?Resp 14 03/13/22 1056  ?SpO2 100 % 03/13/22 1101  ?Vitals shown include unvalidated device data. ? ?Last Pain:  ?Vitals:  ? 03/13/22 1056  ?TempSrc:   ?PainSc: Asleep  ?   ? ?Patients Stated Pain Goal: 3 (03/13/22 7425) ? ?Complications: No notable events documented. ?

## 2022-03-13 NOTE — Anesthesia Postprocedure Evaluation (Signed)
Anesthesia Post Note ? ?Patient: Alexandra Schmitt ? ?Procedure(s) Performed: Cervical four-five, Cervical five-six Anterior Cervical Decompression Discectomy Fusion (Neck) ? ?  ? ?Patient location during evaluation: PACU ?Anesthesia Type: General ?Level of consciousness: awake and alert ?Pain management: pain level controlled ?Vital Signs Assessment: post-procedure vital signs reviewed and stable ?Respiratory status: spontaneous breathing, nonlabored ventilation, respiratory function stable and patient connected to nasal cannula oxygen ?Cardiovascular status: blood pressure returned to baseline and stable ?Postop Assessment: no apparent nausea or vomiting ?Anesthetic complications: no ? ? ?No notable events documented. ? ?Last Vitals:  ?Vitals:  ? 03/13/22 1226 03/13/22 1545  ?BP: (!) 146/83 131/88  ?Pulse: 90 85  ?Resp: 14 18  ?Temp: 36.7 ?C 36.8 ?C  ?SpO2: 95% 94%  ?  ?Last Pain:  ?Vitals:  ? 03/13/22 1554  ?TempSrc:   ?PainSc: 8   ? ? ?  ?  ?  ?  ?  ?  ? ?Alexandra Schmitt ? ? ? ? ?

## 2022-03-14 ENCOUNTER — Other Ambulatory Visit: Payer: Self-pay | Admitting: Neurological Surgery

## 2022-03-14 ENCOUNTER — Encounter (HOSPITAL_COMMUNITY): Payer: Self-pay | Admitting: Neurological Surgery

## 2022-03-14 NOTE — Progress Notes (Signed)
Subjective: ?Patient reports that she is doing well and is pleased with her postoperative status thus far. She has appropriate cervical pain with radiation into the right trapezius and shoulder. Sensation and strength improving, BLE greater than BUE. No acute events overnight.  ? ?Objective: ?Vital signs in last 24 hours: ?Temp:  [97.8 ?F (36.6 ?C)-98.6 ?F (37 ?C)] 98.4 ?F (36.9 ?C) (05/03 0734) ?Pulse Rate:  [81-99] 84 (05/03 0734) ?Resp:  [14-18] 16 (05/03 0734) ?BP: (110-151)/(64-95) 110/68 (05/03 0734) ?SpO2:  [92 %-99 %] 99 % (05/03 0734) ? ?Intake/Output from previous day: ?05/02 0701 - 05/03 0700 ?In: 1480 [P.O.:480; I.V.:1000] ?Out: 50 [Blood:50] ?Intake/Output this shift: ?No intake/output data recorded. ? ?Physical Exam: Patient is awake, A/O X 4, conversant, and in good spirits. Eyes open spontaneously. They are in NAD and VSS. Doing well. Speech is fluent and appropriate. MAEW with good strength that is symmetric bilaterally.  BUE 4-/5 throughout, BLE 4-/5 throughout. Sensation to light touch is decreased in her BUE and BLE. PERLA, EOMI. CNs grossly intact. Dressing is clean dry intact. Incision is well approximated with no drainage, erythema, or edema. ? ? ? ?Lab Results: ?Recent Labs  ?  03/13/22 ?0559  ?WBC 7.7  ?HGB 13.0  ?HCT 39.5  ?PLT 373  ? ?BMET ?No results for input(s): NA, K, CL, CO2, GLUCOSE, BUN, CREATININE, CALCIUM in the last 72 hours. ? ?Studies/Results: ?DG Cervical Spine 2 or 3 views ? ?Result Date: 03/13/2022 ?CLINICAL DATA:  Intraoperative EXAM: CERVICAL SPINE - 2-3 VIEW COMPARISON:  None Available. FINDINGS: Intraoperative images during cervical spine ACDF at C4-C5 and C5-C6. No evidence of immediate complication. IMPRESSION: Intraoperative images during C4-C5 and C5-C6 ACDF. No evidence of immediate complication. Electronically Signed   By: Caprice Renshaw M.D.   On: 03/13/2022 11:31  ? ?DG C-Arm 1-60 Min-No Report ? ?Result Date: 03/13/2022 ?Fluoroscopy was utilized by the requesting  physician.  No radiographic interpretation.  ? ?DG C-Arm 1-60 Min-No Report ? ?Result Date: 03/13/2022 ?Fluoroscopy was utilized by the requesting physician.  No radiographic interpretation.  ? ?DG C-Arm 1-60 Min-No Report ? ?Result Date: 03/13/2022 ?Fluoroscopy was utilized by the requesting physician.  No radiographic interpretation.   ? ?Assessment/Plan: ?Patient is post-op day 1 s/p C4-5 and C5-6 ACDF. She is recovering well and reports improvement of her preoperative symptoms.  Her only complaint is mild incisional, shoulder and upper back discomfort. She has been able to ambulate with a walker and assistance. Sensation and strength are improving. She hs awaiting physical therapy and occupational therapy evaluation.  Continue cervical collar.  Continue working on pain control, mobility and ambulating patient. Patient will likely need CIR placement for later convalescence. Rehab consult has been requested. ? ? LOS: 1 day  ? ? ? ?Council Mechanic, DNP, NP-C ?03/14/2022, 9:11 AM ? ? ? ? ?

## 2022-03-14 NOTE — Progress Notes (Addendum)
Inpatient Rehab Admissions Coordinator:  ? ?Met with patient at bedside to review CIR recommendations and goals of care.  We reviewed 3 hrs/day of therapy with average length of stay 2 weeks (depending on progress) and physician following.  I believe she could reach indep/mod indep level for mobility and ADLs, and PRN assist for iADLs (meal prep, med management, driving, etc).  While she does not have consistent, 24/7 support, she does have good support from her father (mother is undergoing chemo), and her network of friends who have been helping with meal prep and transportation since her fall in early April.  We discussed insurance auth process, inability to discharge to SNF from CIR (if approved and admitted), and difficulty with setting up [facility] so likely need for outpatient therapy follow up.  She is in agreement with all and verbalized understanding.  I will start insurance auth request today.  ? ?Shann Medal, PT, DPT ?Admissions Coordinator ?[phone] ?03/14/22  ?2:38 PM ? ? ?

## 2022-03-14 NOTE — TOC Initial Note (Signed)
Transition of Care (TOC) - Initial/Assessment Note  ? ? ?Patient Details  ?Name: Alexandra Schmitt ?MRN: [id number] ?Date of Birth: December 04, 1980 ? ?Transition of Care (TOC) CM/SW Contact:    ?Kermit Balo, RN ?Phone Number: ?03/14/2022, 11:17 AM ? ?Clinical Narrative:                 ?Patient is from home alone. She has family that is local but unable to provide supervision at this time. Sister lives 4 hours away but is able to come and check on family intermittently.  ?Patient denies any home medications. She has been driving self but has friends that can assist  with transportation.  ?Recommendations are for CIR. Pt is hopeful to be admitted to [facility].  ?TOC following. ? ?Expected Discharge Plan: IP Rehab Facility ?Barriers to Discharge: Continued Medical Work up ? ? ?Patient Goals and CMS Choice ?  ?CMS Medicare.gov Compare Post Acute Care list provided to:: Patient ?Choice offered to / list presented to : Patient ? ?Expected Discharge Plan and Services ?Expected Discharge Plan: IP Rehab Facility ?  ?Discharge Planning Services: CM Consult ?Post Acute Care Choice: IP Rehab ?Living arrangements for the past 2 months: Single Family Home ?                ?  ?  ?  ?  ?  ?  ?  ?  ?  ?  ? ?Prior Living Arrangements/Services ?Living arrangements for the past 2 months: Single Family Home ?Lives with:: Self ?Patient language and need for interpreter reviewed:: Yes ?Do you feel safe going back to the place where you live?: Yes      ?  ?Care giver support system in place?: No (comment) ?Current home services: DME (walker) ?Criminal Activity/Legal Involvement Pertinent to Current Situation/Hospitalization: No - Comment as needed ? ?Activities of Daily Living ?Home Assistive Devices/Equipment: Dan Humphreys (specify type) ?ADL Screening (condition at time of admission) ?Patient's cognitive ability adequate to safely complete daily activities?: Yes ?Is the patient deaf or have difficulty hearing?: No ?Does the patient have  difficulty seeing, even when wearing glasses/contacts?: No ?Does the patient have difficulty concentrating, remembering, or making decisions?: No ?Patient able to express need for assistance with ADLs?: Yes ?Does the patient have difficulty dressing or bathing?: Yes ?Independently performs ADLs?: Yes (appropriate for developmental age) ?Does the patient have difficulty walking or climbing stairs?: Yes ?Weakness of Legs: Both ?Weakness of Arms/Hands: Both ? ?Permission Sought/Granted ?  ?  ?   ?   ?   ?   ? ?Emotional Assessment ?Appearance:: Appears stated age ?Attitude/Demeanor/Rapport: Engaged ?Affect (typically observed): Accepting ?Orientation: : Oriented to Self, Oriented to Place, Oriented to  Time, Oriented to Situation ?  ?Psych Involvement: No (comment) ? ?Admission diagnosis:  Cervical myelopathy (HCC) [G95.9] ?Patient Active Problem List  ? Diagnosis Date Noted  ? Cervical myelopathy (HCC) 03/13/2022  ? Depression 06/16/2012  ? Cigarette smoker one half pack a day or less 01/14/2012  ? Obesity 01/14/2012  ? ?PCP:  Pcp, No ?Pharmacy:   ?[id number] - [place], [place] - [address] ?[address] ?[place] [address] ?Phone: [phone] Fax: [phone] ? ?[id number] - [place], [place] - [address] ?[address] ?[place] [address] ?Phone: [phone] Fax: [phone] ? ? ? ? ?Social Determinants of Health (SDOH) Interventions ?  ? ?Readmission Risk Interventions ?   ? View : No data to  display.  ?  ?  ?  ? ? ? ?

## 2022-03-14 NOTE — Evaluation (Signed)
Physical Therapy Evaluation ?Patient Details ?Name: Alexandra Schmitt ?MRN: [id number] ?DOB: 1981-08-25 ?Today's Date: 03/14/2022 ? ?History of Present Illness ? Pt is a 41 y/o F presenting with BUE weakness after fall on 02/11/2022. 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 S/p C4-5, C5-6 ACDF on 5/2. PMH includes ADHD and anxiety.  ?Clinical Impression ? Pt was seen for therapy with her sister in attendance but this family is not going to be available to help pt at home.  Pt is appropriate for CIR, esp given her geography with stairs to get to bedroom and enter house.  Pt is having spotty localization of sensation on LE's and feet, which may continue to correct as she heals from ACDF.  The residual sensory and motor changes from her injury are sufficient to warrant a CIR stay, with the hope that she will be home with sporadic family and friend help as needed.  Pt is already increasing her walking tolerances, and will see 5x per week as rehab needs are finalized to strengthen and work on standing balance skills to increase safety and independence with gait.   ?   ? ?Recommendations for follow up therapy are one component of a multi-disciplinary discharge planning process, led by the attending physician.  Recommendations may be updated based on patient status, additional functional criteria and insurance authorization. ? ?Follow Up Recommendations Acute inpatient rehab (3hours/day) ? ?  ?Assistance Recommended at Discharge Frequent or constant Supervision/Assistance  ?Patient can return home with the following ? Two people to help with walking and/or transfers;A lot of help with bathing/dressing/bathroom;Assistance with cooking/housework;Assist for transportation;Help with stairs or ramp for entrance ? ?  ?Equipment Recommendations Rolling walker (2 wheels)  ?Recommendations for Other Services ? Rehab consult  ?  ?Functional Status Assessment Patient has had a recent decline in their functional  status and demonstrates the ability to make significant improvements in function in a reasonable and predictable amount of time.  ? ?  ?Precautions / Restrictions Precautions ?Precautions: Cervical ?Precaution Booklet Issued: No ?Precaution Comments: verbally reviewed cervical precautions with pt ?Required Braces or Orthoses: Cervical Brace ?Cervical Brace: Hard collar;At all times ?Restrictions ?Weight Bearing Restrictions: No  ? ?  ? ?Mobility ? Bed Mobility ?  ?  ?  ?  ?  ?  ?  ?General bed mobility comments: sitting on side of bed when PT arrived ?  ? ?Transfers ?Overall transfer level: Needs assistance ?Equipment used: Rolling walker (2 wheels), 1 person hand held assist ?Transfers: Sit to/from Stand ?Sit to Stand: Min assist ?  ?  ?  ?  ?  ?General transfer comment: requires minor assist and cues for intial complete standing and capture of balance, with numbness on hands and LE's hindering the effort ?  ? ?Ambulation/Gait ?Ambulation/Gait assistance: Min assist, +2 physical assistance, +2 safety/equipment (VERY close chair guard) ?Gait Distance (Feet): 60 Feet ?Assistive device: Rolling walker (2 wheels), 1 person hand held assist, Pushed wheelchair, 2 person hand held assist ?Gait Pattern/deviations: Step-through pattern, Decreased stride length, Wide base of support (requires cues for direction and use of walker) ?Gait velocity: reduced ?Gait velocity interpretation: <1.31 ft/sec, indicative of household ambulator ?Pre-gait activities: standing balance and postural correction ?General Gait Details: pt is mildly unsteady but has poor localization of touch on feet and lower legs ? ?Stairs ?  ?  ?  ?  ?  ? ?Wheelchair Mobility ?  ? ?Modified Rankin (Stroke Patients Only) ?  ? ?  ? ?Balance  Overall balance assessment: Needs assistance ?Sitting-balance support: Feet supported ?Sitting balance-Leahy Scale: Good ?  ?  ?Standing balance support: Bilateral upper extremity supported, During functional  activity ?Standing balance-Leahy Scale: Poor ?Standing balance comment: requires walker but also cues for her sensory changes on UE's ?  ?  ?  ?  ?  ?  ?  ?  ?  ?  ?  ?   ? ? ? ?Pertinent Vitals/Pain Pain Assessment ?Pain Assessment: Faces ?Faces Pain Scale: Hurts even more ?Pain Location: shoulders and back, neck ?Pain Descriptors / Indicators: Constant, Discomfort ?Pain Intervention(s): Limited activity within patient's tolerance, Repositioned, Monitored during session, Premedicated before session, Other (comment) (sitting pillow support for trunk)  ? ? ?Home Living Family/patient expects to be discharged to:: Private residence ?Living Arrangements: Alone ?Available Help at Discharge: Family;Available PRN/intermittently ?Type of Home: House ?Home Access: Stairs to enter ?Entrance Stairs-Rails: None ?Entrance Stairs-Number of Steps: 3 ?  ?Home Layout: Two level;Bed/bath upstairs ?Home Equipment: Agricultural consultantolling Walker (2 wheels) ?   ?  ?Prior Function Prior Level of Function : Independent/Modified Independent ?  ?  ?  ?  ?  ?  ?Mobility Comments: RW since about 4/1 ?  ?  ? ? ?Hand Dominance  ? Dominant Hand: Right ? ?  ?Extremity/Trunk Assessment  ? Upper Extremity Assessment ?Upper Extremity Assessment: Defer to OT evaluation ?  ? ?Lower Extremity Assessment ?Lower Extremity Assessment: Generalized weakness ?  ? ?Cervical / Trunk Assessment ?Cervical / Trunk Assessment: Neck Surgery  ?Communication  ? Communication: No difficulties  ?Cognition Arousal/Alertness: Awake/alert ?Behavior During Therapy: [facility] for tasks assessed/performed ?Overall Cognitive Status: Within Functional Limits for tasks assessed ?  ?  ?  ?  ?  ?  ?  ?  ?  ?  ?  ?  ?  ?  ?  ?  ?  ?  ?  ? ?  ?General Comments General comments (skin integrity, edema, etc.): pt is up to walk with help, has closely guarded balance due to buckling appearance of LE's ? ?  ?Exercises    ? ?Assessment/Plan  ?  ?PT Assessment Patient needs continued PT services  ?PT Problem  List Decreased strength;Decreased activity tolerance;Decreased balance;Decreased mobility;Decreased coordination;Decreased knowledge of use of DME;Pain;Decreased skin integrity ? ?   ?  ?PT Treatment Interventions DME instruction;Gait training;Stair training;Functional mobility training;Therapeutic activities;Therapeutic exercise;Balance training;Neuromuscular re-education;Patient/family education   ? ?PT Goals (Current goals can be found in the Care Plan section)  ?Acute Rehab PT Goals ?Patient Stated Goal: to get stronger and go home ?PT Goal Formulation: With patient ?Time For Goal Achievement: 03/21/22 ?Potential to Achieve Goals: Good ? ?  ?Frequency Min 5X/week ?  ? ? ?Co-evaluation   ?  ?  ?  ?  ? ? ?  ?AM-PAC PT "6 Clicks" Mobility  ?Outcome Measure Help needed turning from your back to your side while in a flat bed without using bedrails?: A Lot ?Help needed moving from lying on your back to sitting on the side of a flat bed without using bedrails?: A Little ?Help needed moving to and from a bed to a chair (including a wheelchair)?: A Little ?Help needed standing up from a chair using your arms (e.g., wheelchair or bedside chair)?: A Little ?Help needed to walk in hospital room?: A Lot ?Help needed climbing 3-5 steps with a railing? : Total ?6 Click Score: 14 ? ?  ?End of Session Equipment Utilized During Treatment: Cervical collar ?Activity Tolerance: Patient tolerated treatment  well;Patient limited by fatigue;Treatment limited secondary to medical complications (Comment);Patient limited by pain ?Patient left: in chair;with call bell/phone within reach;with family/visitor present ?Nurse Communication: Mobility status ?PT Visit Diagnosis: Unsteadiness on feet (R26.81);Muscle weakness (generalized) (M62.81);Pain ?Pain - Right/Left:  (back and shoulders) ?Pain - part of body: Shoulder (back neck) ?  ? ?Time: 8588-5027 ?PT Time Calculation (min) (ACUTE ONLY): 26 min ? ? ?Charges:   PT Evaluation ?$PT Eval  Moderate Complexity: 1 Mod ?PT Treatments ?$Gait Training: 8-22 mins ?  ?   ? ?Ivar Drape ?03/14/2022, 1:02 PM ? ?Samul Dada, PT PhD ?Acute Rehab Dept. Number: [facility] [phone] and [facility] [phone] ? ? ?

## 2022-03-14 NOTE — Evaluation (Signed)
Occupational Therapy Evaluation ?Patient Details ?Name: Alexandra MoritaMegan M Schmitt ?MRN: [id number] ?DOB: Feb 01, 1981 ?Today's Date: 03/14/2022 ? ? ?History of Present Illness Pt is a 41 y/o F presenting with BUE weakness after fall on 02/11/2022. 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 S/p C4-5, C5-6 ACDF on 5/2. PMH includes ADHD and anxiety.  ? ?Clinical Impression ?  ?Pt reporting increasing difficulty with ADLs and mobility since fall, using RW for mobility, reports bathing sinkside and difficulty with fine motor aspects of dressing (buttons, snaps, etc). Pt currently requiring min -max A for ADLs, min guard for bed mobility, and min A for transfers with RW. Pt presenting with decreased sensation, strength, and fine motor coordination in BUE, L >R, unable to oppose finger to thumb, or form full fist. Administered built up tubing for utensils and squeeze ball, demo'd use. Pt verbalized understanding. Pt presenting with impairments listed below, will follow acutely. Recommend AIR at d/c. ?   ? ?Recommendations for follow up therapy are one component of a multi-disciplinary discharge planning process, led by the attending physician.  Recommendations may be updated based on patient status, additional functional criteria and insurance authorization.  ? ?Follow Up Recommendations ? Acute inpatient rehab (3hours/day)  ?  ?Assistance Recommended at Discharge Intermittent Supervision/Assistance  ?Patient can return home with the following A lot of help with walking and/or transfers;A lot of help with bathing/dressing/bathroom;Assistance with cooking/housework;Assist for transportation;Help with stairs or ramp for entrance ? ?  ?Functional Status Assessment ? Patient has had a recent decline in their functional status and demonstrates the ability to make significant improvements in function in a reasonable and predictable amount of time.  ?Equipment Recommendations ? BSC/3in1;Tub/shower seat  ?   ?Recommendations for Other Services PT consult;Rehab consult ? ? ?  ?Precautions / Restrictions Precautions ?Precautions: Cervical ?Precaution Booklet Issued: No ?Precaution Comments: verbally reviewed cervical precautions with pt ?Required Braces or Orthoses: Cervical Brace ?Cervical Brace: Hard collar;At all times ?Restrictions ?Weight Bearing Restrictions: No  ? ?  ? ?Mobility Bed Mobility ?Overal bed mobility: Needs Assistance ?Bed Mobility: Sidelying to Sit ?  ?Sidelying to sit: Min guard ?  ?  ?  ?General bed mobility comments: with use of log rolling technique ?  ? ?Transfers ?Overall transfer level: Needs assistance ?Equipment used: Rolling walker (2 wheels) ?Transfers: Sit to/from Stand ?Sit to Stand: Min assist ?  ?  ?  ?  ?  ?General transfer comment: increased time in standing to steady self ?  ? ?  ?Balance Overall balance assessment: Needs assistance ?Sitting-balance support: No upper extremity supported, Feet supported ?Sitting balance-Leahy Scale: Good ?Sitting balance - Comments: can reach outside BOS without LOB ?  ?Standing balance support: During functional activity, Reliant on assistive device for balance, Bilateral upper extremity supported ?Standing balance-Leahy Scale: Poor ?Standing balance comment: reliant on external support ?  ?  ?  ?  ?  ?  ?  ?  ?  ?  ?  ?   ? ?ADL either performed or assessed with clinical judgement  ? ?ADL Overall ADL's : Needs assistance/impaired ?Eating/Feeding: With adaptive utensils;Sitting;Minimal assistance ?  ?Grooming: Minimal assistance;With adaptive equipment;Sitting ?  ?Upper Body Bathing: Minimal assistance;Sitting ?  ?Lower Body Bathing: Maximal assistance;Moderate assistance;Cueing for back precautions ?  ?Upper Body Dressing : Minimal assistance;Sitting ?  ?Lower Body Dressing: Maximal assistance;Sit to/from stand;Sitting/lateral leans ?Lower Body Dressing Details (indicate cue type and reason): difficulty performing figure 4 ?Toilet Transfer: Min  guard;Rolling walker (2 wheels);Comfort height  toilet ?Toilet Transfer Details (indicate cue type and reason): with BSC over toilet ?Toileting- Clothing Manipulation and Hygiene: Supervision/safety;Sitting/lateral lean ?  ?  ?  ?Functional mobility during ADLs: Min guard;Rolling walker (2 wheels) ?   ? ? ? ?Vision   ?Vision Assessment?: No apparent visual deficits  ?   ?Perception   ?  ?Praxis   ?  ? ?Pertinent Vitals/Pain Pain Assessment ?Pain Assessment: Faces ?Pain Score: 7  ?Faces Pain Scale: Hurts whole lot ?Pain Location: shoulders and back ?Pain Descriptors / Indicators: Constant, Discomfort ?Pain Intervention(s): Limited activity within patient's tolerance, Monitored during session, Premedicated before session  ? ? ? ?Hand Dominance Right ?  ?Extremity/Trunk Assessment Upper Extremity Assessment ?Upper Extremity Assessment: Generalized weakness;RUE deficits/detail;LUE deficits/detail (L >R weakness) ?RUE Deficits / Details: grasp 3/5 ?RUE Sensation: decreased light touch ?RUE Coordination: decreased fine motor;decreased gross motor ?LUE Deficits / Details: grasp 3/5 ?LUE Sensation: decreased light touch ?LUE Coordination: decreased fine motor;decreased gross motor ?  ?Lower Extremity Assessment ?Lower Extremity Assessment: Defer to PT evaluation ?  ?Cervical / Trunk Assessment ?Cervical / Trunk Assessment: Neck Surgery ?  ?Communication Communication ?Communication: No difficulties ?  ?Cognition Arousal/Alertness: Awake/alert ?Behavior During Therapy: [facility] for tasks assessed/performed ?Overall Cognitive Status: Within Functional Limits for tasks assessed ?  ?  ?  ?  ?  ?  ?  ?  ?  ?  ?  ?  ?  ?  ?  ?  ?  ?  ?  ?General Comments  VSS on RA, sister present in room toward end of session ? ?  ?Exercises   ?  ?Shoulder Instructions    ? ? ?Home Living Family/patient expects to be discharged to:: Private residence ?Living Arrangements: Alone ?Available Help at Discharge: Family;Available PRN/intermittently  (intermittent if at all) ?Type of Home: House ?Home Access: Stairs to enter ?Entrance Stairs-Number of Steps: 2 ?  ?Home Layout: Two level;Bed/bath upstairs ?  ?  ?Bathroom Shower/Tub: Tub/shower unit;Curtain ?  ?Bathroom Toilet: Standard ?  ?  ?Home Equipment: Agricultural consultant (2 wheels) ?  ?  ?  ? ?  ?Prior Functioning/Environment Prior Level of Function : Independent/Modified Independent ?  ?  ?  ?  ?  ?  ?Mobility Comments: has been using RW since fall in April ?ADLs Comments: increased time, difficulty with buttoning (fine motor skills), reports bathing sinkside, friend has assisted pt to use walk-in shower at her home 1-2x since fall ?  ? ?  ?  ?OT Problem List: Decreased strength;Decreased range of motion;Decreased activity tolerance;Impaired balance (sitting and/or standing);Decreased coordination;Impaired sensation;Impaired UE functional use ?  ?   ?OT Treatment/Interventions: Self-care/ADL training;Therapeutic exercise;DME and/or AE instruction;Therapeutic activities;Patient/family education;Balance training;Manual therapy;Modalities  ?  ?OT Goals(Current goals can be found in the care plan section) Acute Rehab OT Goals ?Patient Stated Goal: to get better ?OT Goal Formulation: With patient ?Time For Goal Achievement: 03/28/22 ?Potential to Achieve Goals: Good ?ADL Goals ?Pt Will Perform Grooming: with adaptive equipment;with supervision ?Pt Will Perform Upper Body Dressing: with min guard assist;sitting;standing ?Pt Will Perform Lower Body Dressing: with min assist;sit to/from stand;sitting/lateral leans;with adaptive equipment ?Pt Will Transfer to Toilet: with supervision;ambulating;regular height toilet ?Pt Will Perform Tub/Shower Transfer: Shower transfer;Tub transfer;ambulating;rolling walker;shower seat  ?OT Frequency: Min 3X/week ?  ? ?Co-evaluation   ?  ?  ?  ?  ? ?  ?AM-PAC OT "6 Clicks" Daily Activity     ?Outcome Measure Help from another person eating meals?: A Little ?Help from another  person  taking care of personal grooming?: A Little ?Help from another person toileting, which includes using toliet, bedpan, or urinal?: A Lot ?Help from another person bathing (including washing, rinsing, drying)?: A Lot ?Help from anoth

## 2022-03-15 ENCOUNTER — Encounter (HOSPITAL_COMMUNITY): Payer: Self-pay | Admitting: Neurological Surgery

## 2022-03-15 DIAGNOSIS — M792 Neuralgia and neuritis, unspecified: Secondary | ICD-10-CM | POA: Diagnosis not present

## 2022-03-15 DIAGNOSIS — G8918 Other acute postprocedural pain: Secondary | ICD-10-CM | POA: Diagnosis not present

## 2022-03-15 DIAGNOSIS — S14154D Other incomplete lesion at C4 level of cervical spinal cord, subsequent encounter: Secondary | ICD-10-CM | POA: Diagnosis not present

## 2022-03-15 DIAGNOSIS — M791 Myalgia, unspecified site: Secondary | ICD-10-CM | POA: Diagnosis not present

## 2022-03-15 DIAGNOSIS — Z8249 Family history of ischemic heart disease and other diseases of the circulatory system: Secondary | ICD-10-CM | POA: Diagnosis not present

## 2022-03-15 DIAGNOSIS — Z4789 Encounter for other orthopedic aftercare: Secondary | ICD-10-CM | POA: Diagnosis not present

## 2022-03-15 DIAGNOSIS — S13161A Dislocation of C5/C6 cervical vertebrae, initial encounter: Secondary | ICD-10-CM | POA: Diagnosis present

## 2022-03-15 DIAGNOSIS — M4712 Other spondylosis with myelopathy, cervical region: Secondary | ICD-10-CM

## 2022-03-15 DIAGNOSIS — M4802 Spinal stenosis, cervical region: Secondary | ICD-10-CM | POA: Diagnosis present

## 2022-03-15 DIAGNOSIS — G8250 Quadriplegia, unspecified: Secondary | ICD-10-CM | POA: Diagnosis not present

## 2022-03-15 DIAGNOSIS — M2578 Osteophyte, vertebrae: Secondary | ICD-10-CM | POA: Diagnosis present

## 2022-03-15 DIAGNOSIS — M50021 Cervical disc disorder at C4-C5 level with myelopathy: Secondary | ICD-10-CM | POA: Diagnosis present

## 2022-03-15 DIAGNOSIS — Z87891 Personal history of nicotine dependence: Secondary | ICD-10-CM | POA: Diagnosis not present

## 2022-03-15 DIAGNOSIS — Z7409 Other reduced mobility: Secondary | ICD-10-CM | POA: Diagnosis not present

## 2022-03-15 DIAGNOSIS — S14154A Other incomplete lesion at C4 level of cervical spinal cord, initial encounter: Secondary | ICD-10-CM | POA: Diagnosis present

## 2022-03-15 DIAGNOSIS — Z6838 Body mass index (BMI) 38.0-38.9, adult: Secondary | ICD-10-CM | POA: Diagnosis not present

## 2022-03-15 DIAGNOSIS — G959 Disease of spinal cord, unspecified: Secondary | ICD-10-CM | POA: Diagnosis not present

## 2022-03-15 DIAGNOSIS — R1310 Dysphagia, unspecified: Secondary | ICD-10-CM | POA: Diagnosis not present

## 2022-03-15 DIAGNOSIS — W19XXXA Unspecified fall, initial encounter: Secondary | ICD-10-CM | POA: Diagnosis present

## 2022-03-15 DIAGNOSIS — K5900 Constipation, unspecified: Secondary | ICD-10-CM | POA: Diagnosis not present

## 2022-03-15 DIAGNOSIS — S13151A Dislocation of C4/C5 cervical vertebrae, initial encounter: Secondary | ICD-10-CM | POA: Diagnosis present

## 2022-03-15 DIAGNOSIS — M50022 Cervical disc disorder at C5-C6 level with myelopathy: Secondary | ICD-10-CM | POA: Diagnosis present

## 2022-03-15 DIAGNOSIS — G629 Polyneuropathy, unspecified: Secondary | ICD-10-CM | POA: Diagnosis not present

## 2022-03-15 NOTE — H&P (Signed)
? ? ?Physical Medicine and Rehabilitation Admission H&P ? ?  ?CC:  Functional deficits due to cervical myelopathy.  ? ?HPI:  Alexandra Schmitt is a 41 year old female with history of ADHD, depression/anxiety d/o, fall 02/12/24 followed by BUE weakness, numbness, tingling and difficulty walking. She was evaluated by Dr. 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 She was admitted on 03/13/22 for ACDF C4-C6 with cervical collar to be worn at all times. PT/OT ongoing and patient limited by balance deficits with unsteady gait, sensory deficits BUE/BLE, weakness as well as neck/shoulder pain. CIR recommended due to recent functional decline.  Currently has incisional pain ? ? ?Review of Systems  ?Constitutional: Negative.   ?HENT: Negative.    ?Eyes: Negative.   ?Cardiovascular: Negative.   ?Gastrointestinal: Negative.   ?Genitourinary: Negative.   ?Musculoskeletal:  Positive for joint pain and myalgias.  ?Skin: Negative.   ?Neurological:  Positive for sensory change and weakness.  ?Endo/Heme/Allergies: Negative.   ? ? ?Past Medical History:  ?Diagnosis Date  ? ADHD (attention deficit hyperactivity disorder)   ? Anxiety   ? ? ?Past Surgical History:  ?Procedure Laterality Date  ? ANTERIOR CERVICAL DECOMP/DISCECTOMY FUSION N/A 03/13/2022  ? Procedure: Cervical four-five, Cervical five-six Anterior Cervical Decompression Discectomy Fusion;  Surgeon: Dawley, Alan Mulder, DO;  Location: [facility] OR;  Service: Neurosurgery;  Laterality: N/A;  ? BROKEN NOSE    ? RIGHT FOOT RECONSTRUCTION    ? TONSILLECTOMY    ? ? ?Family History  ?Problem Relation Age of Onset  ? Hypertension Father   ? Cancer Maternal Grandmother   ?     LUNG  ? Cancer Maternal Grandfather   ?     LUNG  ? Cancer Paternal Grandfather   ?     LUNG  ? ? ?Social History: Lives alone and independent PTA.  reports that she has quit smoking. She quit smokeless tobacco use about 7 years ago. She reports current  alcohol use. She reports that she does not use drugs. ? ? ?Allergies: No Known Allergies ? ? ?Medications Prior to Admission  ?Medication Sig Dispense Refill  ? ALPRAZolam (XANAX) 0.5 MG tablet Take 0.5 mg by mouth at bedtime as needed for sleep or anxiety.    ? Carboxymethylcellulose Sodium (LUBRICANT EYE DROPS OP) Place 1 drop into both eyes daily as needed (dry eyes).    ? ibuprofen (ADVIL) 200 MG tablet Take 400 mg by mouth every 6 (six) hours as needed for headache or moderate pain.    ? ? ? Home: ?Home Living ?Family/patient expects to be discharged to:: Private residence ?Living Arrangements: Alone ?Available Help at Discharge: Family, Available PRN/intermittently ?Type of Home: House ?Home Access: Stairs to enter ?Entrance Stairs-Number of Steps: 3 ?Entrance Stairs-Rails: None ?Home Layout: Two level, Bed/bath upstairs ?Bathroom Shower/Tub: Tub/shower unit, Curtain ?Bathroom Toilet: Standard ?Home Equipment: Agricultural consultant (2 wheels) ?  ?Functional History: ?Prior Function ?Prior Level of Function : Independent/Modified Independent ?Mobility Comments: RW since about 4/1 ?ADLs Comments: increased time, difficulty with buttoning (fine motor skills), reports bathing sinkside, friend has assisted pt to use walk-in shower at her home 1-2x since fall ?  ?Functional Status:  ?Mobility: ?Bed Mobility ?Overal bed mobility: Needs Assistance ?Bed Mobility: Sidelying to Sit ?Sidelying to sit: Min guard ?General bed mobility comments: sitting on side of bed when PT arrived ?Transfers ?Overall transfer level: Needs assistance ?Equipment used: Rolling walker (2 wheels), 1 person  hand held assist ?Transfers: Sit to/from Stand ?Sit to Stand: Min assist ?General transfer comment: requires minor assist and cues for intial complete standing and capture of balance, with numbness on hands and LE's hindering the effort ?Ambulation/Gait ?Ambulation/Gait assistance: Min assist, +2 physical assistance, +2 safety/equipment (VERY close  chair guard) ?Gait Distance (Feet): 60 Feet ?Assistive device: Rolling walker (2 wheels), 1 person hand held assist, Pushed wheelchair, 2 person hand held assist ?Gait Pattern/deviations: Step-through pattern, Decreased stride length, Wide base of support (requires cues for direction and use of walker) ?General Gait Details: pt is mildly unsteady but has poor localization of touch on feet and lower legs ?Gait velocity: reduced ?Gait velocity interpretation: <1.31 ft/sec, indicative of household ambulator ?Pre-gait activities: standing balance and postural correction ?  ?ADL: ?ADL ?Overall ADL's : Needs assistance/impaired ?Eating/Feeding: With adaptive utensils, Sitting, Minimal assistance ?Grooming: Minimal assistance, With adaptive equipment, Sitting ?Upper Body Bathing: Minimal assistance, Sitting ?Lower Body Bathing: Maximal assistance, Moderate assistance, Cueing for back precautions ?Upper Body Dressing : Minimal assistance, Sitting ?Lower Body Dressing: Maximal assistance, Sit to/from stand, Sitting/lateral leans ?Lower Body Dressing Details (indicate cue type and reason): difficulty performing figure 4 ?Toilet Transfer: Min guard, Rolling walker (2 wheels), Comfort height toilet ?Toilet Transfer Details (indicate cue type and reason): with BSC over toilet ?Toileting- Clothing Manipulation and Hygiene: Supervision/safety, Sitting/lateral lean ?Functional mobility during ADLs: Min guard, Rolling walker (2 wheels) ?  ?Cognition: ?Cognition ?Overall Cognitive Status: Within Functional Limits for tasks assessed ?Orientation Level: Oriented X4 ?Cognition ?Arousal/Alertness: Awake/alert ?Behavior During Therapy: [facility] for tasks assessed/performed ?Overall Cognitive Status: Within Functional Limits for tasks assessed ?  ? ? ?Last menstrual period 03/07/2022. ?Physical Exam ?Gen: no distress, normal appearing, family at bedside, BMI 38.41 ?HEENT: oral mucosa pink and moist, cervical collar in place.  ?Cardio: Reg  rate ?Chest: normal effort, normal rate of breathing ?Abd: soft, non-distended ?Ext: no edema ?Psych: pleasant, normal affect ?Skin: cervical incision c/d/i ?Neuro/Musculoskeletal: Alert and oriented x4. Cognition intact. Decreased sensation all extremities. 4/5 strength throughout.  ? ?No results found for this or any previous visit (from the past 48 hour(s)). ?No results found. ? ? ? ?Last menstrual period 03/07/2022. ? ?Medical Problem List and Plan: ?1. Functional deficits secondary to cervical myelopathy ? -patient may shower but incision must be covered ? -ELOS/Goals: modI 10-14 days  ? -Admit to CIR ?2.  Antithrombotics: ?-DVT/anticoagulation:  Mechanical: Sequential compression devices, below knee Bilateral lower extremities ? -antiplatelet therapy: N/A ?3. Incisional pain: continue Oxycodone prn ?4. Mood: LCSW to follow for evaluation and support.  ? -antipsychotic agents: N/A ?5. Neuropsych: This patient is capable of making decisions on her own behalf. ?6. Skin/Wound Care: Routine pressure relief measures.  ?7. Fluids/Electrolytes/Nutrition: Monitor I/O. Check CMET in am ?8. ADHD/Anxiety: In the past-->no meds for years. ?9.  Myalgias: add kpad ?19. Morbid obesity: BMI 38.41: provide dietary education ? ?I have personally performed a face to face diagnostic evaluation, including, but not limited to relevant history and physical exam findings, of this patient and developed relevant assessment and plan.  Additionally, I have reviewed and concur with the physician assistant's documentation above. ? ?Delle Reining, PA-C ? ?Horton Chin, MD ?03/15/2022  ?

## 2022-03-15 NOTE — Progress Notes (Signed)
Physical Therapy Treatment ?Patient Details ?Name: Alexandra Schmitt ?MRN: [id number] ?DOB: 1981-08-16 ?Today's Date: 03/15/2022 ? ? ?History of Present Illness Pt is a 41 y/o F presenting with BUE weakness after fall on 02/11/2022. 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 S/p C4-5, C5-6 ACDF on 5/2. PMH includes ADHD and anxiety. ? ?  ?PT Comments  ? ? Despite 8/10 neck pain and back spasms pt remains very motivated and tolerated therapy well today. Pt continues with impaired sensation and weakness throughout all 4 extremities however with cues was able to use the sensation in her hips to start to figure out appropriate base of support width as pt was near scissoring at beginning of session. Pt was indep and working PTA. Pt to greatly benefit from AIR upon d/c for maximal functional recovery as she demonstrates significant rehab potential. Acute PT to cont to follow. ?   ?Recommendations for follow up therapy are one component of a multi-disciplinary discharge planning process, led by the attending physician.  Recommendations may be updated based on patient status, additional functional criteria and insurance authorization. ? ?Follow Up Recommendations ? Acute inpatient rehab (3hours/day) ?  ?  ?Assistance Recommended at Discharge Frequent or constant Supervision/Assistance  ?Patient can return home with the following Two people to help with walking and/or transfers;A lot of help with bathing/dressing/bathroom;Assistance with cooking/housework;Assist for transportation;Help with stairs or ramp for entrance ?  ?Equipment Recommendations ? Rolling walker (2 wheels)  ?  ?Recommendations for Other Services Rehab consult ? ? ?  ?Precautions / Restrictions Precautions ?Precautions: Cervical ?Precaution Booklet Issued: No ?Precaution Comments: verbally reviewed cervical precautions with pt ?Required Braces or Orthoses: Cervical Brace ?Cervical Brace: Hard collar;At all times ?Restrictions ?Weight  Bearing Restrictions: No  ?  ? ?Mobility ? Bed Mobility ?Overal bed mobility: Needs Assistance ?Bed Mobility: Rolling, Sidelying to Sit ?Rolling: Supervision ?Sidelying to sit: Supervision ?  ?  ?  ?General bed mobility comments: HOB elevated, increased time, verbal cues, no physical assist ?  ? ?Transfers ?Overall transfer level: Needs assistance ?Equipment used: Rolling walker (2 wheels) ?Transfers: Sit to/from Stand ?Sit to Stand: Min assist ?  ?  ?  ?  ?  ?General transfer comment: minA to power up, increased time, minA to steady during transition of hands from bed to RW ?  ? ?Ambulation/Gait ?Ambulation/Gait assistance: Min assist, +2 safety/equipment ?Gait Distance (Feet): 70 Feet (x1, 60x1) ?Assistive device: Rolling walker (2 wheels) ?Gait Pattern/deviations: Step-through pattern, Decreased stride length, Steppage, Ataxic ?Gait velocity: reduced ?Gait velocity interpretation: <1.31 ft/sec, indicative of household ambulator ?  ?General Gait Details: pt with impaired sensation and having difficulty with consistent step length of steps, pt also with adduction of R LE > L, verbal cues to try to keep BOS wider and focus on sensation at hips vs at feet, pt limited by "My back is on fire" due to back spasms ? ? ?Stairs ?  ?  ?  ?  ?  ? ? ?Wheelchair Mobility ?  ? ?Modified Rankin (Stroke Patients Only) ?  ? ? ?  ?Balance Overall balance assessment: Needs assistance ?Sitting-balance support: Feet supported ?Sitting balance-Leahy Scale: Good ?  ?  ?Standing balance support: Bilateral upper extremity supported, During functional activity ?Standing balance-Leahy Scale: Poor ?Standing balance comment: requires walker and external assist for safe ambulation ?  ?  ?  ?  ?  ?  ?  ?  ?  ?  ?  ?  ? ?  ?Cognition  Arousal/Alertness: Awake/alert ?Behavior During Therapy: [facility] for tasks assessed/performed ?Overall Cognitive Status: Within Functional Limits for tasks assessed ?  ?  ?  ?  ?  ?  ?  ?  ?  ?  ?  ?  ?  ?  ?  ?  ?  ?  ?   ? ?  ?Exercises Other Exercises ?Other Exercises: pt pushed self back in chair via bilat LEs x 10 reps ?Other Exercises: gave isometric bilat LE HEP for in bed ? ?  ?General Comments General comments (skin integrity, edema, etc.): pt c/o back spasms otherwise VSS ?  ?  ? ?Pertinent Vitals/Pain Pain Assessment ?Pain Assessment: 0-10 ?Pain Score: 8  ?Pain Location: neck, back spasms ?Pain Intervention(s): Monitored during session  ? ? ?Home Living   ?  ?  ?  ?  ?  ?  ?  ?  ?  ?   ?  ?Prior Function    ?  ?  ?   ? ?PT Goals (current goals can now be found in the care plan section) Acute Rehab PT Goals ?Patient Stated Goal: to stop the back spasms ?Progress towards PT goals: Not progressing toward goals - comment ? ?  ?Frequency ? ? ? Min 5X/week ? ? ? ?  ?PT Plan Current plan remains appropriate  ? ? ?Co-evaluation   ?  ?  ?  ?  ? ?  ?AM-PAC PT "6 Clicks" Mobility   ?Outcome Measure ? Help needed turning from your back to your side while in a flat bed without using bedrails?: A Little ?Help needed moving from lying on your back to sitting on the side of a flat bed without using bedrails?: A Little ?Help needed moving to and from a bed to a chair (including a wheelchair)?: A Lot ?Help needed standing up from a chair using your arms (e.g., wheelchair or bedside chair)?: A Lot ?Help needed to walk in hospital room?: A Lot ?Help needed climbing 3-5 steps with a railing? : A Lot ?6 Click Score: 14 ? ?  ?End of Session Equipment Utilized During Treatment: Gait belt;Cervical collar ?Activity Tolerance: Patient tolerated treatment well (despite back pain) ?Patient left: in chair;with call bell/phone within reach;with chair alarm set ?Nurse Communication: Mobility status ?PT Visit Diagnosis: Unsteadiness on feet (R26.81);Muscle weakness (generalized) (M62.81);Pain ?Pain - part of body:  (back) ?  ? ? ?Time: 1245-1315 ?PT Time Calculation (min) (ACUTE ONLY): 30 min ? ?Charges:  $Gait Training: 23-37 mins          ?           ? ?Lewis Shock, PT, DPT ?Acute Rehabilitation Services ?Secure chat preferred ?Office #: [phone] ? ? ? ?Kyshawn Teal M Ramonda Galyon ?03/15/2022, 2:17 PM ? ?

## 2022-03-15 NOTE — PMR Pre-admission (Addendum)
PMR Admission Coordinator Pre-Admission Assessment ? ?Patient: Alexandra Schmitt is an 41 y.o., female ?MRN: [id number] ?DOB: [birth year] ?Height: 5' 6"  (167.6 cm) ?Weight: 108 kg ? ?Insurance Information ?HMO: yes    PPO:      PCP:      IPA:      80/20:      OTHER:  ?PRIMARY: [employer]      Policy#: [id number]      Subscriber: pt ?CM Name: Mickel Baas      Phone#: [phone] ext [phone]     Fax#: [phone] ?Pre-Cert#: [id number] Josem Kaufmann for CIR from Mickel Baas at [place] with updates due to Gaspar Garbe at fax listed above on 5/10 (phone [phone] ext [phone])      Employer:  ?Benefits:  Phone #: [phone]     Name:  ?Eff. Date: 11/12/21     Deduct: $2750 (met 232.20)      Out of Pocket Max: $6250 (met $232.20)      Life Max: n/a ?CIR: 80%      SNF: 80% ?Outpatient: 80%     Co-Ins: 20% ?Home Health: 100%      Co-Pay:  ?DME: 100%     Co-Pay:  ?Providers:  ?SECONDARY:       Policy#:      Phone#:  ? ?Financial Counselor:       Phone#:  ? ?The ?Data Collection Information Summary? for patients in Inpatient Rehabilitation Facilities with attached ?Privacy Act Annex Records? was provided and verbally reviewed with: N/A ? ?Emergency Contact Information ?Contact Information   ? ? Name Relation Home Work Mobile  ? Lemus,Michael Father   [phone]  ? ?  ? ? ?Current Medical History  ?Patient Admitting Diagnosis: cervical myelopathy  ?History of Present Illness: Alexandra Schmitt is a 41 year old female with history of ADHD, depression/anxiety d/o, fall 02/12/24 followed by BUE weakness, numbness, tingling and difficulty walking, which progressed over time. She was evaluated by Dr. 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 She was admitted on 03/13/22 for ACDF C4-C6 with cervical collar to be worn at all times. PT/OT ongoing and patient limited by balance deficits with unsteady gait, sensory deficits BUE/BLE, weakness as well as neck/shoulder pain.  CIR recommended due to recent functional decline.   ?  ? ?Patient's [facility] record from [facility] has been reviewed by the rehabilitation admission coordinator and physician. ? ?Past Medical History  ?Past Medical History:  ?Diagnosis Date  ? ADHD (attention deficit hyperactivity disorder)   ? Anxiety   ? ? ?Has the patient had major surgery during 100 days prior to admission? Yes ? ?Family History   ?family history includes Cancer in her maternal grandfather, maternal grandmother, and paternal grandfather; Hypertension in her father. ? ?Current Medications ? ?Current Facility-Administered Medications:  ?  0.9 %  sodium chloride infusion, 250 mL, Intravenous, Continuous, Dawley, Troy C, DO, Last Rate: 1 mL/hr at 03/13/22 1806, 250 mL at 03/13/22 1806 ?  acetaminophen (TYLENOL) tablet 650 mg, 650 mg, Oral, Q4H PRN **OR** acetaminophen (TYLENOL) suppository 650 mg, 650 mg, Rectal, Q4H PRN, Dawley, Troy C, DO ?  ALPRAZolam (XANAX) tablet 0.5 mg, 0.5 mg, Oral, QHS PRN, Dawley, Troy C, DO ?  Chlorhexidine Gluconate Cloth 2 % PADS 6 each, 6 each, Topical, Q0600, Dawley, Troy C, DO, 6 each at 03/15/22 1500 ?  docusate sodium (COLACE) capsule 100 mg, 100 mg, Oral, BID, Dawley, Theodoro Doing,  DO, 100 mg at 03/15/22 1003 ?  HYDROmorphone (DILAUDID) injection 0.5 mg, 0.5 mg, Intravenous, Q3H PRN, Dawley, Troy C, DO ?  lactated ringers infusion, , Intravenous, Continuous, Dawley, Troy C, DO, Stopping previously hung infusion at 03/13/22 1500 ?  menthol-cetylpyridinium (CEPACOL) lozenge 3 mg, 1 lozenge, Oral, PRN **OR** phenol (CHLORASEPTIC) mouth spray 1 spray, 1 spray, Mouth/Throat, PRN, Dawley, Troy C, DO ?  methocarbamol (ROBAXIN) tablet 500 mg, 500 mg, Oral, Q6H PRN, 500 mg at 03/15/22 1254 **OR** methocarbamol (ROBAXIN) 500 mg in dextrose 5 % 50 mL IVPB, 500 mg, Intravenous, Q6H PRN, Dawley, Troy C, DO ?  mupirocin ointment (BACTROBAN) 2 % 1 application., 1 application., Nasal, BID, Dawley, Troy C, DO, 1 application. at 03/15/22  0959 ?  ondansetron (ZOFRAN) tablet 4 mg, 4 mg, Oral, Q6H PRN **OR** ondansetron (ZOFRAN) injection 4 mg, 4 mg, Intravenous, Q6H PRN, Dawley, Troy C, DO ?  oxyCODONE (Oxy IR/ROXICODONE) immediate release tablet 10 mg, 10 mg, Oral, Q3H PRN, Dawley, Troy C, DO, 10 mg at 03/15/22 1254 ?  oxyCODONE (Oxy IR/ROXICODONE) immediate release tablet 5 mg, 5 mg, Oral, Q3H PRN, Dawley, Troy C, DO, 5 mg at 03/14/22 1858 ?  sodium chloride flush (NS) 0.9 % injection 3 mL, 3 mL, Intravenous, Q12H, Dawley, Troy C, DO, 3 mL at 03/14/22 2353 ?  sodium chloride flush (NS) 0.9 % injection 3 mL, 3 mL, Intravenous, PRN, Dawley, Troy C, DO ? ?Patients Current Diet:  ?Diet Order   ? ?       ?  DIET DYS 3 Room service appropriate? Yes; Fluid consistency: Thin  Diet effective now       ?  ? ?  ?  ? ?  ? ? ?Precautions / Restrictions ?Precautions ?Precautions: Cervical ?Precaution Booklet Issued: No ?Precaution Comments: verbally reviewed cervical precautions with pt ?Cervical Brace: Hard collar, At all times ?Restrictions ?Weight Bearing Restrictions: No  ? ?Has the patient had 2 or more falls or a fall with injury in the past year? Yes ? ?Prior Activity Level ?Community (5-7x/wk): independent, working at [employer], driving, no limitations. ? ?Prior Functional Level ?Self Care: Did the patient need help bathing, dressing, using the toilet or eating? Independent ? ?Indoor Mobility: Did the patient need assistance with walking from room to room (with or without device)? Independent ? ?Stairs: Did the patient need assistance with internal or external stairs (with or without device)? Independent ? ?Functional Cognition: Did the patient need help planning regular tasks such as shopping or remembering to take medications? Independent ? ?Patient Information ?Are you of Hispanic, Latino/a,or Spanish origin?: A. No, not of Hispanic, Latino/a, or Spanish origin ?What is your race?: A. White ?Do you need or want an interpreter to communicate with a doctor or  health care staff?: 0. No ? ?Patient's Response To:  ?Health Literacy and Transportation ?Is the patient able to respond to health literacy and transportation needs?: Yes ?Health Literacy - How often do you need to have someone help you when you read instructions, pamphlets, or other written material from your doctor or pharmacy?: Never ?In the past 12 months, has lack of transportation kept you from medical appointments or from getting medications?: No ?In the past 12 months, has lack of transportation kept you from meetings, work, or from getting things needed for daily living?: No ? ?Home Assistive Devices / Equipment ?Home Assistive Devices/Equipment: Gilford Rile (specify type) ?Home Equipment: Conservation officer, nature (2 wheels) ? ?Prior Device Use: Indicate devices/aids used by the patient prior  to current illness, exacerbation or injury? None of the above ? ?Current Functional Level ?Cognition ? Overall Cognitive Status: Within Functional Limits for tasks assessed ?Orientation Level: Oriented X4 ?   ?Extremity Assessment ?(includes Sensation/Coordination) ? Upper Extremity Assessment: Defer to OT evaluation ?RUE Deficits / Details: grasp 3/5 ?RUE Sensation: decreased light touch ?RUE Coordination: decreased fine motor, decreased gross motor ?LUE Deficits / Details: grasp 3/5 ?LUE Sensation: decreased light touch ?LUE Coordination: decreased fine motor, decreased gross motor  ?Lower Extremity Assessment: Generalized weakness  ?  ?ADLs ? Overall ADL's : Needs assistance/impaired ?Eating/Feeding: With adaptive utensils, Sitting, Minimal assistance ?Grooming: Minimal assistance, With adaptive equipment, Sitting ?Upper Body Bathing: Minimal assistance, Sitting ?Lower Body Bathing: Maximal assistance, Moderate assistance, Cueing for back precautions ?Upper Body Dressing : Minimal assistance, Sitting ?Lower Body Dressing: Maximal assistance, Sit to/from stand, Sitting/lateral leans ?Lower Body Dressing Details (indicate cue type  and reason): difficulty performing figure 4 ?Toilet Transfer: Min guard, Rolling walker (2 wheels), Comfort height toilet ?Toilet Transfer Details (indicate cue type and reason): with BSC over toilet ?

## 2022-03-15 NOTE — Plan of Care (Signed)
Pt is alert oriented x 4. Pt has neck brace in place. Pt c/o pain to neck and muscle spasms, pt received 10mg  of oxycodone with robaxin, pt stated her pain was improving. Pt is a +1 assist to bathroom with walker. Pt states her grip is still weak. The sensation on her extremities are numbness and tingling in different areas.  ? ? ? ? ?Problem: Education: ?Goal: Knowledge of General Education information will improve ?Description: Including pain rating scale, medication(s)/side effects and non-pharmacologic comfort measures ?Outcome: Progressing ?  ?Problem: Clinical Measurements: ?Goal: Ability to maintain clinical measurements within normal limits will improve ?Outcome: Progressing ?Goal: Will remain free from infection ?Outcome: Progressing ?Goal: Diagnostic test results will improve ?Outcome: Progressing ?Goal: Respiratory complications will improve ?Outcome: Progressing ?Goal: Cardiovascular complication will be avoided ?Outcome: Progressing ?  ?Problem: Activity: ?Goal: Risk for activity intolerance will decrease ?Outcome: Progressing ?  ?Problem: Nutrition: ?Goal: Adequate nutrition will be maintained ?Outcome: Progressing ?  ?Problem: Coping: ?Goal: Level of anxiety will decrease ?Outcome: Progressing ?  ?Problem: Elimination: ?Goal: Will not experience complications related to bowel motility ?Outcome: Progressing ?Goal: Will not experience complications related to urinary retention ?Outcome: Progressing ?  ?Problem: Pain Managment: ?Goal: General experience of comfort will improve ?Outcome: Progressing ?  ?Problem: Safety: ?Goal: Ability to remain free from injury will improve ?Outcome: Progressing ?  ?Problem: Skin Integrity: ?Goal: Risk for impaired skin integrity will decrease ?Outcome: Progressing ?  ?

## 2022-03-15 NOTE — Progress Notes (Signed)
Subjective: ?Patient reports that she is continuing to note slow improvement. She has appropriate upper back/ shoulder discomfort. No acute events overnight.  ? ?Objective: ?Vital signs in last 24 hours: ?Temp:  [98 ?F (36.7 ?C)-98.5 ?F (36.9 ?C)] 98.5 ?F (36.9 ?C) (05/04 [id number]) ?Pulse Rate:  [65-81] 81 (05/04 0723) ?Resp:  [16-17] 16 (05/04 0723) ?BP: (97-124)/(55-100) 118/66 (05/04 0723) ?SpO2:  [97 %-98 %] 97 % (05/04 0723) ? ?Intake/Output from previous day: ?05/03 0701 - 05/04 0700 ?In: 120 [P.O.:120] ?Out: -  ?Intake/Output this shift: ?No intake/output data recorded. ? ?Physical Exam: Patient is awake, A/O X 4, conversant, and in good spirits. Eyes open spontaneously. They are in NAD and VSS. Doing well. Speech is fluent and appropriate. MAEW with good strength that is symmetric bilaterally.  BUE 4-/5 throughout, BLE 4-/5 throughout. Sensation to light touch is decreased in her BUE and BLE. PERLA, EOMI. CNs grossly intact. Dressing is clean dry intact. Incision is well approximated with no drainage, erythema, or edema. ? ?Lab Results: ?Recent Labs  ?  03/13/22 ?0559  ?WBC 7.7  ?HGB 13.0  ?HCT 39.5  ?PLT 373  ? ?BMET ?No results for input(s): NA, K, CL, CO2, GLUCOSE, BUN, CREATININE, CALCIUM in the last 72 hours. ? ?Studies/Results: ?No results found. ? ?Assessment/Plan: ?Patient is post-op day 2 s/p C4-5 and C5-6 ACDF. She is continuing to recover well and reports continued slow improvement of her preoperative symptoms.  She has mild incisional, shoulder and upper back discomfort that appears appropriate. Sensation and strength are improving. She has worked with PT and OT who are recommending CIR. Rehab consult placed. Awaiting insurance approval.  Continue hard cervical collar.  Continue working on pain control, mobility and ambulating patient.  ? ? LOS: 1 day  ? ? ? ?Marvis Moeller, DNP, NP-C ?03/15/2022, 11:02 AM ? ? ? ? ?

## 2022-03-15 NOTE — Progress Notes (Signed)
Inpatient Rehab Admissions Coordinator:  ? ?Awaiting determination from [facility] regarding CIR prior auth request.  ? ?Shann Medal, PT, DPT ?Admissions Coordinator ?[phone] ?03/15/22  ?1:45 PM ? ?

## 2022-03-15 NOTE — H&P (Incomplete)
? ? ?Physical Medicine and Rehabilitation Admission H&P ? ?  ?CC:  Functional deficits due to cervical myelopathy.  ? ?HPI:  Alexandra Schmitt is a 41 year old female with history of ADHD, depression/anxiety d/o, fall 02/12/24 followed by BUE weakness, numbness, tingling and difficulty walking. She was evaluated by Dr. 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 She was admitted on 03/13/22 for ACDF C4-C6 with cervical collar to be worn at all times. PT/OT ongoing and patient limited by balance deficits with unsteady gait, sensory deficits BUE/BLE, weakness as well as neck/shoulder pain. CIR recommended due to recent functional decline.   ? ? ?ROS ? ? ?Past Medical History:  ?Diagnosis Date  ? ADHD (attention deficit hyperactivity disorder)   ? Anxiety   ? ? ?Past Surgical History:  ?Procedure Laterality Date  ? ANTERIOR CERVICAL DECOMP/DISCECTOMY FUSION N/A 03/13/2022  ? Procedure: Cervical four-five, Cervical five-six Anterior Cervical Decompression Discectomy Fusion;  Surgeon: Dawley, Alan Mulder, DO;  Location: [facility] OR;  Service: Neurosurgery;  Laterality: N/A;  ? BROKEN NOSE    ? RIGHT FOOT RECONSTRUCTION    ? TONSILLECTOMY    ? ? ?Family History  ?Problem Relation Age of Onset  ? Hypertension Father   ? Cancer Maternal Grandmother   ?     LUNG  ? Cancer Maternal Grandfather   ?     LUNG  ? Cancer Paternal Grandfather   ?     LUNG  ? ? ?Social History: Lives alone and independent PTA.  reports that she has quit smoking. She quit smokeless tobacco use about 7 years ago. She reports current alcohol use. She reports that she does not use drugs. ? ? ?Allergies: No Known Allergies ? ? ?Medications Prior to Admission  ?Medication Sig Dispense Refill  ? ALPRAZolam (XANAX) 0.5 MG tablet Take 0.5 mg by mouth at bedtime as needed for sleep or anxiety.    ? Carboxymethylcellulose Sodium (LUBRICANT EYE DROPS OP) Place 1 drop into both eyes daily as needed (dry  eyes).    ? ibuprofen (ADVIL) 200 MG tablet Take 400 mg by mouth every 6 (six) hours as needed for headache or moderate pain.    ? ? ? ? ?Home: ?Home Living ?Family/patient expects to be discharged to:: Private residence ?Living Arrangements: Alone ?Available Help at Discharge: Family, Available PRN/intermittently ?Type of Home: House ?Home Access: Stairs to enter ?Entrance Stairs-Number of Steps: 3 ?Entrance Stairs-Rails: None ?Home Layout: Two level, Bed/bath upstairs ?Bathroom Shower/Tub: Tub/shower unit, Curtain ?Bathroom Toilet: Standard ?Home Equipment: Agricultural consultant (2 wheels) ?  ?Functional History: ?Prior Function ?Prior Level of Function : Independent/Modified Independent ?Mobility Comments: RW since about 4/1 ?ADLs Comments: increased time, difficulty with buttoning (fine motor skills), reports bathing sinkside, friend has assisted pt to use walk-in shower at her home 1-2x since fall ? ?Functional Status:  ?Mobility: ?Bed Mobility ?Overal bed mobility: Needs Assistance ?Bed Mobility: Sidelying to Sit ?Sidelying to sit: Min guard ?General bed mobility comments: sitting on side of bed when PT arrived ?Transfers ?Overall transfer level: Needs assistance ?Equipment used: Rolling walker (2 wheels), 1 person hand held assist ?Transfers: Sit to/from Stand ?Sit to Stand: Min assist ?General transfer comment: requires minor assist and cues for intial complete standing and capture of balance, with numbness on hands and LE's hindering the effort ?Ambulation/Gait ?Ambulation/Gait assistance: Min assist, +2 physical assistance, +2 safety/equipment (VERY close chair guard) ?Gait Distance (Feet): 60 Feet ?Assistive  device: Rolling walker (2 wheels), 1 person hand held assist, Pushed wheelchair, 2 person hand held assist ?Gait Pattern/deviations: Step-through pattern, Decreased stride length, Wide base of support (requires cues for direction and use of walker) ?General Gait Details: pt is mildly unsteady but has poor  localization of touch on feet and lower legs ?Gait velocity: reduced ?Gait velocity interpretation: <1.31 ft/sec, indicative of household ambulator ?Pre-gait activities: standing balance and postural correction ?  ? ?ADL: ?ADL ?Overall ADL's : Needs assistance/impaired ?Eating/Feeding: With adaptive utensils, Sitting, Minimal assistance ?Grooming: Minimal assistance, With adaptive equipment, Sitting ?Upper Body Bathing: Minimal assistance, Sitting ?Lower Body Bathing: Maximal assistance, Moderate assistance, Cueing for back precautions ?Upper Body Dressing : Minimal assistance, Sitting ?Lower Body Dressing: Maximal assistance, Sit to/from stand, Sitting/lateral leans ?Lower Body Dressing Details (indicate cue type and reason): difficulty performing figure 4 ?Toilet Transfer: Min guard, Rolling walker (2 wheels), Comfort height toilet ?Toilet Transfer Details (indicate cue type and reason): with BSC over toilet ?Toileting- Clothing Manipulation and Hygiene: Supervision/safety, Sitting/lateral lean ?Functional mobility during ADLs: Min guard, Rolling walker (2 wheels) ? ?Cognition: ?Cognition ?Overall Cognitive Status: Within Functional Limits for tasks assessed ?Orientation Level: Oriented X4 ?Cognition ?Arousal/Alertness: Awake/alert ?Behavior During Therapy: [facility] for tasks assessed/performed ?Overall Cognitive Status: Within Functional Limits for tasks assessed ? ? ?Blood pressure 118/66, pulse 81, temperature 98.5 ?F (36.9 ?C), temperature source Oral, resp. rate 16, height 5\' 6"  (1.676 m), weight 108 kg, last menstrual period 03/07/2022, SpO2 97 %. ?Physical Exam ? ?No results found for this or any previous visit (from the past 48 hour(s)). ?No results found. ? ? ? ?Blood pressure 118/66, pulse 81, temperature 98.5 ?F (36.9 ?C), temperature source Oral, resp. rate 16, height 5\' 6"  (1.676 m), weight 108 kg, last menstrual period 03/07/2022, SpO2 97 %. ? ?Medical Problem List and Plan: ?1. Functional deficits  secondary to *** ? -patient may *** shower ? -ELOS/Goals: *** ?2.  Antithrombotics: ?-DVT/anticoagulation:  Mechanical: Sequential compression devices, below knee Bilateral lower extremities ? -antiplatelet therapy: N/A ?3. Pain Management:  Oxycodone prn ?4. Mood: LCSW to follow for evaluation and support.  ? -antipsychotic agents: N/A ?5. Neuropsych: This patient is capable of making decisions on her own behalf. ?6. Skin/Wound Care: Routine pressure relief measures.  ?7. Fluids/Electrolytes/Nutrition: Monitor I/O. Check CMET in am ?8. ADHD/Anxiety: In the past-->no meds for years. ?9.   ? ? ? ? ?*** ? ? , PA-C ?03/15/2022  ?

## 2022-03-16 ENCOUNTER — Encounter (HOSPITAL_COMMUNITY): Payer: Self-pay | Admitting: Physical Medicine and Rehabilitation

## 2022-03-16 ENCOUNTER — Inpatient Hospital Stay (HOSPITAL_COMMUNITY)
Admission: RE | Admit: 2022-03-16 | Discharge: 2022-03-28 | DRG: 945 | Disposition: A | Discharge status: Home / Self Care | Payer: Managed Care, Other (non HMO) | Source: Intra-hospital | Attending: Physical Medicine and Rehabilitation | Admitting: Physical Medicine and Rehabilitation

## 2022-03-16 ENCOUNTER — Other Ambulatory Visit: Payer: Self-pay

## 2022-03-16 DIAGNOSIS — M4804 Spinal stenosis, thoracic region: Secondary | ICD-10-CM | POA: Diagnosis present

## 2022-03-16 DIAGNOSIS — R260 Ataxic gait: Secondary | ICD-10-CM | POA: Diagnosis present

## 2022-03-16 DIAGNOSIS — G8252 Quadriplegia, C1-C4 incomplete: Secondary | ICD-10-CM | POA: Diagnosis present

## 2022-03-16 DIAGNOSIS — K5900 Constipation, unspecified: Secondary | ICD-10-CM | POA: Diagnosis present

## 2022-03-16 DIAGNOSIS — Z6838 Body mass index (BMI) 38.0-38.9, adult: Secondary | ICD-10-CM | POA: Diagnosis not present

## 2022-03-16 DIAGNOSIS — Z4789 Encounter for other orthopedic aftercare: Secondary | ICD-10-CM | POA: Diagnosis present

## 2022-03-16 DIAGNOSIS — W19XXXD Unspecified fall, subsequent encounter: Secondary | ICD-10-CM | POA: Diagnosis present

## 2022-03-16 DIAGNOSIS — M50021 Cervical disc disorder at C4-C5 level with myelopathy: Secondary | ICD-10-CM | POA: Diagnosis present

## 2022-03-16 DIAGNOSIS — Z8249 Family history of ischemic heart disease and other diseases of the circulatory system: Secondary | ICD-10-CM

## 2022-03-16 DIAGNOSIS — G959 Disease of spinal cord, unspecified: Secondary | ICD-10-CM

## 2022-03-16 DIAGNOSIS — E669 Obesity, unspecified: Secondary | ICD-10-CM | POA: Diagnosis present

## 2022-03-16 DIAGNOSIS — G629 Polyneuropathy, unspecified: Secondary | ICD-10-CM

## 2022-03-16 DIAGNOSIS — G8250 Quadriplegia, unspecified: Secondary | ICD-10-CM | POA: Diagnosis present

## 2022-03-16 DIAGNOSIS — M791 Myalgia, unspecified site: Secondary | ICD-10-CM | POA: Diagnosis not present

## 2022-03-16 DIAGNOSIS — M792 Neuralgia and neuritis, unspecified: Secondary | ICD-10-CM | POA: Diagnosis not present

## 2022-03-16 DIAGNOSIS — Z981 Arthrodesis status: Secondary | ICD-10-CM

## 2022-03-16 DIAGNOSIS — Z87891 Personal history of nicotine dependence: Secondary | ICD-10-CM

## 2022-03-16 DIAGNOSIS — M50022 Cervical disc disorder at C5-C6 level with myelopathy: Secondary | ICD-10-CM | POA: Diagnosis present

## 2022-03-16 DIAGNOSIS — R209 Unspecified disturbances of skin sensation: Secondary | ICD-10-CM

## 2022-03-16 DIAGNOSIS — S14154D Other incomplete lesion at C4 level of cervical spinal cord, subsequent encounter: Principal | ICD-10-CM

## 2022-03-16 DIAGNOSIS — Z7409 Other reduced mobility: Secondary | ICD-10-CM | POA: Diagnosis not present

## 2022-03-16 DIAGNOSIS — K644 Residual hemorrhoidal skin tags: Secondary | ICD-10-CM | POA: Diagnosis present

## 2022-03-16 DIAGNOSIS — R1310 Dysphagia, unspecified: Secondary | ICD-10-CM | POA: Diagnosis not present

## 2022-03-16 MED ORDER — PHENOL 1.4 % MT LIQD
1.0000 | OROMUCOSAL | Status: DC | PRN
  Filled 2022-03-16: qty 177

## 2022-03-16 MED ORDER — BISACODYL 10 MG RE SUPP
10.0000 mg | Freq: Every day | RECTAL | Status: DC | PRN
Start: 2022-03-16 — End: 2022-03-28

## 2022-03-16 MED ORDER — PROCHLORPERAZINE MALEATE 5 MG PO TABS: 5.0000 mg | ORAL_TABLET | Freq: Four times a day (QID) | ORAL | Status: DC | PRN

## 2022-03-16 MED ORDER — ALPRAZOLAM 0.5 MG PO TABS: 0.5000 mg | ORAL_TABLET | Freq: Every evening | ORAL | Status: DC | PRN

## 2022-03-16 MED ORDER — DOCUSATE SODIUM 100 MG PO CAPS: 100.0000 mg | ORAL_CAPSULE | Freq: Two times a day (BID) | ORAL | Status: DC

## 2022-03-16 MED ORDER — GUAIFENESIN-DM 100-10 MG/5ML PO SYRP: 5.0000 mL | ORAL_SOLUTION | Freq: Four times a day (QID) | ORAL | Status: DC | PRN

## 2022-03-16 MED ORDER — DIPHENHYDRAMINE HCL 12.5 MG/5ML PO ELIX: 12.5000 mg | ORAL_SOLUTION | Freq: Four times a day (QID) | ORAL | Status: DC | PRN

## 2022-03-16 MED ORDER — PROCHLORPERAZINE EDISYLATE 10 MG/2ML IJ SOLN: 5.0000 mg | Freq: Four times a day (QID) | INTRAMUSCULAR | Status: DC | PRN

## 2022-03-16 MED ORDER — POLYETHYLENE GLYCOL 3350 17 G PO PACK: 17.0000 g | PACK | Freq: Every day | ORAL | Status: DC | PRN

## 2022-03-16 MED ORDER — METHOCARBAMOL 500 MG PO TABS
500.0000 mg | ORAL_TABLET | Freq: Four times a day (QID) | ORAL | Status: DC | PRN
  Administered 2022-03-16 – 2022-03-26 (×11): 500 mg via ORAL
  Filled 2022-03-16 (×12): qty 1

## 2022-03-16 MED ORDER — MENTHOL 3 MG MT LOZG: 1.0000 | LOZENGE | OROMUCOSAL | Status: DC | PRN

## 2022-03-16 MED ORDER — TRAZODONE HCL 50 MG PO TABS
25.0000 mg | ORAL_TABLET | Freq: Every evening | ORAL | Status: DC | PRN
  Administered 2022-03-19 – 2022-03-27 (×9): 50 mg via ORAL
  Filled 2022-03-16 (×9): qty 1

## 2022-03-16 MED ORDER — SENNOSIDES-DOCUSATE SODIUM 8.6-50 MG PO TABS
2.0000 | ORAL_TABLET | Freq: Every day | ORAL | Status: DC
  Administered 2022-03-16 – 2022-03-27 (×12): 2 via ORAL
  Filled 2022-03-16 (×12): qty 2

## 2022-03-16 MED ORDER — FLEET ENEMA 7-19 GM/118ML RE ENEM: 1.0000 | ENEMA | Freq: Once | RECTAL | Status: DC | PRN

## 2022-03-16 MED ORDER — GABAPENTIN 100 MG PO CAPS
100.0000 mg | ORAL_CAPSULE | Freq: Every day | ORAL | Status: DC
  Administered 2022-03-16 – 2022-03-27 (×12): 100 mg via ORAL
  Filled 2022-03-16 (×12): qty 1

## 2022-03-16 MED ORDER — PROCHLORPERAZINE 25 MG RE SUPP: 12.5000 mg | Freq: Four times a day (QID) | RECTAL | Status: DC | PRN

## 2022-03-16 MED ORDER — ALUM & MAG HYDROXIDE-SIMETH 200-200-20 MG/5ML PO SUSP: 30.0000 mL | ORAL | Status: DC | PRN

## 2022-03-16 MED ORDER — ACETAMINOPHEN 325 MG PO TABS
325.0000 mg | ORAL_TABLET | ORAL | Status: DC | PRN
  Administered 2022-03-19 – 2022-03-27 (×5): 650 mg via ORAL
  Filled 2022-03-16 (×5): qty 2

## 2022-03-16 MED ORDER — MUPIROCIN 2 % EX OINT
1.0000 "application " | TOPICAL_OINTMENT | Freq: Two times a day (BID) | CUTANEOUS | Status: AC
  Administered 2022-03-16 – 2022-03-18 (×4): 1 via NASAL
  Filled 2022-03-16 (×2): qty 22

## 2022-03-16 MED ORDER — OXYCODONE HCL 5 MG PO TABS
5.0000 mg | ORAL_TABLET | ORAL | Status: DC | PRN
  Administered 2022-03-17 – 2022-03-25 (×20): 10 mg via ORAL
  Administered 2022-03-25: 5 mg via ORAL
  Filled 2022-03-16 (×19): qty 2
  Filled 2022-03-16: qty 1
  Filled 2022-03-16: qty 2

## 2022-03-16 NOTE — Progress Notes (Signed)
Occupational Therapy Treatment ?Patient Details ?Name: Alexandra Schmitt ?MRN: [id number] ?DOB: Jan 16, 1981 ?Today's Date: 03/16/2022 ? ? ?History of present illness Pt is a 41 y/o F presenting with BUE weakness after fall on 02/11/2022. 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 S/p C4-5, C5-6 ACDF on 5/2. PMH includes ADHD and anxiety. ?  ?OT comments ? Pt. Seen for skilled OT session.  Pt. Able to completed LB dressing seated with compensatory techniques.  Ambulation to/from b.room for toileting task min a with cues for UE management secondary to decreased sensation.  Pt. Receptive to cues and has good return demo.  Remains motivated and eager for progress. Note d/c later today to CIR pt. Is very excited to continue with therapies prior to home.    ? ?Recommendations for follow up therapy are one component of a multi-disciplinary discharge planning process, led by the attending physician.  Recommendations may be updated based on patient status, additional functional criteria and insurance authorization. ?   ?Follow Up Recommendations ? Acute inpatient rehab (3hours/day)  ?  ?Assistance Recommended at Discharge Intermittent Supervision/Assistance  ?Patient can return home with the following ? A lot of help with walking and/or transfers;A lot of help with bathing/dressing/bathroom;Assistance with cooking/housework;Assist for transportation;Help with stairs or ramp for entrance ?  ?Equipment Recommendations ? BSC/3in1;Tub/shower seat  ?  ?Recommendations for Other Services   ? ?  ?Precautions / Restrictions Precautions ?Precautions: Cervical ?Precaution Comments: verbally reviewed cervical precautions with pt ?Required Braces or Orthoses: Cervical Brace ?Cervical Brace: Hard collar;At all times  ? ? ?  ? ?Mobility Bed Mobility ?Overal bed mobility: Needs Assistance ?Bed Mobility: Supine to Sit ?  ?  ?Supine to sit: Min guard ?  ?  ?General bed mobility comments: HOB elevated, increased time, verbal  cues, no physical assist ?  ? ?Transfers ?  ?Equipment used: Rolling walker (2 wheels) ?Transfers: Sit to/from Stand, Bed to chair/wheelchair/BSC ?Sit to Stand: Min assist ?  ?  ?Step pivot transfers: Min assist ?  ?  ?General transfer comment: minA to power up, increased time, minA to steady during transition of hands from bed to RW ?  ?  ?Balance   ?  ?  ?  ?  ?  ?  ?  ?  ?  ?  ?  ?  ?  ?  ?  ?  ?  ?  ?   ? ?ADL either performed or assessed with clinical judgement  ? ?ADL Overall ADL's : Needs assistance/impaired ?Eating/Feeding: Cueing for compensatory techinques;Cueing for sequencing;Sitting ?Eating/Feeding Details (indicate cue type and reason): able to open containers and peel lids using both hands to support the item and peel/open with the other hand ?Grooming: Wash/dry hands;Wash/dry face;Min guard;Sitting;Standing ?Grooming Details (indicate cue type and reason): pt. utilized hand sanitizer machine for hand care and also washed face in sitting with set up ?  ?  ?  ?  ?  ?  ?Lower Body Dressing: Min guard;Sitting/lateral leans;Cueing for compensatory techniques;Cueing for sequencing ?Lower Body Dressing Details (indicate cue type and reason): able to turn while seated in bed each direction and bring each le with knee bent to reach each foot for don/doff socks ?Toilet Transfer: Min guard;Rolling walker (2 wheels);Comfort height toilet ?Toilet Transfer Details (indicate cue type and reason): with BSC over toilet ?Toileting- Architect and Hygiene: Min guard;Sit to/from stand;Cueing for compensatory techniques;Cueing for sequencing ?Toileting - Clothing Manipulation Details (indicate cue type and reason): cues for hand placement and  cont. to check if hand in place secondary to reports of numb digits on L hand (ring finger and pinky) ?  ?  ?Functional mobility during ADLs: Min guard;Minimal assistance;Rolling walker (2 wheels) ?General ADL Comments: pt. aware of limited grasp use of L hand greater  than R.  good demonstration of compensatory tech. with cues.  educated on ways to strengthen grasp with use of both hands and digits for adls, using phone, turning pages, opening containers.  pt. very motivated and receptive to these suggestions/recommendations ?  ? ?Extremity/Trunk Assessment   ?  ?  ?  ?  ?  ? ?Vision   ?  ?  ?Perception   ?  ?Praxis   ?  ? ?Cognition Arousal/Alertness: Awake/alert ?Behavior During Therapy: [facility] for tasks assessed/performed ?Overall Cognitive Status: Within Functional Limits for tasks assessed ?  ?  ?  ?  ?  ?  ?  ?  ?  ?  ?  ?  ?  ?  ?  ?  ?  ?  ?  ?   ?Exercises   ? ?  ?Shoulder Instructions   ? ? ?  ?General Comments  Pt. Has a lab mix Odie that she adores.  Her brother is currently watching him.  If able she would love to have him visit even if it is where she can be outside to be with him.  I reviewed I was unsure of the specific policies but would pass this along.  I think it would be great for her if he could come visit.    ? ? ?Pertinent Vitals/ Pain       Pain Assessment ?Pain Assessment: Faces ?Faces Pain Scale: Hurts little more ?Pain Location: neck, back spasms ?Pain Descriptors / Indicators: Aching ?Pain Intervention(s): Limited activity within patient's tolerance, Monitored during session, Repositioned ? ?Home Living   ?  ?  ?  ?  ?  ?  ?  ?  ?  ?  ?  ?  ?  ?  ?  ?  ?  ?  ? ?  ?Prior Functioning/Environment    ?  ?  ?  ?   ? ?Frequency ? Min 3X/week  ? ? ? ? ?  ?Progress Toward Goals ? ?OT Goals(current goals can now be found in the care plan section) ? Progress towards OT goals: Progressing toward goals ? ?   ?Plan Discharge plan remains appropriate   ? ?Co-evaluation ? ? ?   ?  ?  ?  ?  ? ?  ?AM-PAC OT "6 Clicks" Daily Activity     ?Outcome Measure ? ? Help from another person eating meals?: A Little ?Help from another person taking care of personal grooming?: A Little ?Help from another person toileting, which includes using toliet, bedpan, or urinal?: A Lot ?Help  from another person bathing (including washing, rinsing, drying)?: A Lot ?Help from another person to put on and taking off regular upper body clothing?: A Lot ?Help from another person to put on and taking off regular lower body clothing?: A Lot ?6 Click Score: 14 ? ?  ?End of Session Equipment Utilized During Treatment: Gait belt;Rolling walker (2 wheels);Cervical collar ? ?OT Visit Diagnosis: Unsteadiness on feet (R26.81);Other abnormalities of gait and mobility (R26.89);History of falling (Z91.81);Muscle weakness (generalized) (M62.81);Other symptoms and signs involving the nervous system (R29.898) ?  ?Activity Tolerance Patient tolerated treatment well ?  ?Patient Left in chair;with call bell/phone within reach ?  ?Nurse Communication   ?  ? ?   ? ?  Time: 1010-1036 ?OT Time Calculation (min): 26 min ? ?Charges: OT General Charges ?$OT Visit: 1 Visit ?OT Treatments ?$Self Care/Home Management : 23-37 mins ? ?Boneta LucksJenny, COTA/L ?Acute Rehabilitation ?[phone]  ? ?Salvadore OxfordM, Tab Rylee Lorraine ?03/16/2022, 10:56 AM ?

## 2022-03-16 NOTE — Progress Notes (Signed)
Physical Therapy Treatment ?Patient Details ?Name: Alexandra Schmitt ?MRN: [id number] ?DOB: [birth year] ?Today's Date: 03/16/2022 ? ? ?History of Present Illness Pt is a 41 y/o F presenting with BUE weakness after fall on 02/11/2022. 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 S/p C4-5, C5-6 ACDF on 5/2. PMH includes ADHD and anxiety. ? ?  ?PT Comments  ? ? Pt was seen for last PT visit prior to moving to CIR.  Her progression of gait was at times min guard for shorter trips, can stand with UE support min guard and is compensating well for sensory changes of UE's and LE's.  Follow along with her to get standing balance and control of safety with gait at optimal levels, and encourage pt to practice as much gait and standing balance work as tolerable.  Pt is very hot to work in hard collar, and encouraged her to ask MD about how long it will be before she can take a break from brace. For now of course is 24/7 to use hard collar.  ?Recommendations for follow up therapy are one component of a multi-disciplinary discharge planning process, led by the attending physician.  Recommendations may be updated based on patient status, additional functional criteria and insurance authorization. ? ?Follow Up Recommendations ? Acute inpatient rehab (3hours/day) ?  ?  ?Assistance Recommended at Discharge Frequent or constant Supervision/Assistance  ?Patient can return home with the following A lot of help with bathing/dressing/bathroom;Assistance with cooking/housework;Assist for transportation;Help with stairs or ramp for entrance;A lot of help with walking and/or transfers ?  ?Equipment Recommendations ? Rolling walker (2 wheels)  ?  ?Recommendations for Other Services Rehab consult ? ? ?  ?Precautions / Restrictions Precautions ?Precautions: Cervical ?Precaution Comments: verbally reviewed cervical precautions with pt ?Required Braces or Orthoses: Cervical Brace ?Cervical Brace: Hard collar;At all  times ?Restrictions ?Weight Bearing Restrictions: No  ?  ? ?Mobility ? Bed Mobility ?Overal bed mobility: Needs Assistance ?Bed Mobility: Supine to Sit, Sit to Supine ?  ?  ?Supine to sit: Min guard ?Sit to supine: Min guard ?  ?General bed mobility comments: using rail and HOB elevated ?  ? ?Transfers ?Overall transfer level: Needs assistance ?Equipment used: Rolling walker (2 wheels) ?Transfers: Sit to/from Stand, Bed to chair/wheelchair/BSC ?Sit to Stand: Min assist ?  ?  ?  ?  ?  ?General transfer comment: practiced clearing her hips with minimization of UE use ?  ? ?Ambulation/Gait ?Ambulation/Gait assistance: Min guard, Min assist ?Gait Distance (Feet): 60 Feet (30 x 2) ?Assistive device: Rolling walker (2 wheels) ?Gait Pattern/deviations: Step-through pattern, Wide base of support, Drifts right/left, Decreased stride length ?Gait velocity: reduced ?Gait velocity interpretation: <1.31 ft/sec, indicative of household ambulator ?  ?General Gait Details: pt is distracted by pain in spine, her lack of sensation in hands and her feet, and general fatigue with the effort to move ? ? ?Stairs ?  ?  ?  ?  ?  ? ? ?Wheelchair Mobility ?  ? ?Modified Rankin (Stroke Patients Only) ?  ? ? ?  ?Balance Overall balance assessment: Needs assistance ?Sitting-balance support: Feet supported ?Sitting balance-Leahy Scale: Good ?  ?  ?  ?Standing balance-Leahy Scale: Poor ?Standing balance comment: RW with gait belt for safety ?  ?  ?  ?  ?  ?  ?  ?  ?  ?  ?  ?  ? ?  ?Cognition Arousal/Alertness: Awake/alert ?Behavior During Therapy: [facility] for tasks assessed/performed ?Overall Cognitive Status: Within Functional  Limits for tasks assessed ?  ?  ?  ?  ?  ?  ?  ?  ?  ?  ?  ?  ?  ?  ?  ?  ?  ?  ?  ? ?  ?Exercises   ? ?  ?General Comments General comments (skin integrity, edema, etc.): pt is more stable in standing and walking then last PT visit per notes, and seems more related to generally practicing to get to BR every time and altering  her sitting and standing posture of legs ?  ?  ? ?Pertinent Vitals/Pain Pain Assessment ?Pain Assessment: 0-10 ?Pain Score: 5  ?Pain Location: neck, back spasms ?Pain Descriptors / Indicators: Aching  ? ? ?Home Living   ?  ?  ?  ?  ?  ?  ?  ?  ?  ?   ?  ?Prior Function    ?  ?  ?   ? ?PT Goals (current goals can now be found in the care plan section) Acute Rehab PT Goals ?Patient Stated Goal: reduce pain and get to rehab ?Progress towards PT goals: Progressing toward goals ? ?  ?Frequency ? ? ? Min 5X/week ? ? ? ?  ?PT Plan Current plan remains appropriate  ? ? ?Co-evaluation   ?  ?  ?  ?  ? ?  ?AM-PAC PT "6 Clicks" Mobility   ?Outcome Measure ? Help needed turning from your back to your side while in a flat bed without using bedrails?: None ?Help needed moving from lying on your back to sitting on the side of a flat bed without using bedrails?: A Little ?Help needed moving to and from a bed to a chair (including a wheelchair)?: A Little ?Help needed standing up from a chair using your arms (e.g., wheelchair or bedside chair)?: A Little ?Help needed to walk in hospital room?: A Little ?Help needed climbing 3-5 steps with a railing? : A Lot ?6 Click Score: 18 ? ?  ?End of Session Equipment Utilized During Treatment: Gait belt;Cervical collar ?Activity Tolerance: Patient tolerated treatment well ?Patient left: with call bell/phone within reach;in bed;with bed alarm set ?Nurse Communication: Mobility status ?PT Visit Diagnosis: Unsteadiness on feet (R26.81);Muscle weakness (generalized) (M62.81);Pain ?Pain - Right/Left:  (posterior neck and back) ?  ? ? ?Time: 9242-6834 ?PT Time Calculation (min) (ACUTE ONLY): 28 min ? ?Charges:  $Gait Training: 8-22 mins ?$Therapeutic Activity: 8-22 mins    ?Ivar Drape ?03/16/2022, 3:56 PM ? ?Samul Dada, PT PhD ?Acute Rehab Dept. Number: [facility] [phone] and [facility] [phone] ? ? ?

## 2022-03-16 NOTE — Progress Notes (Signed)
Inpatient Rehab Admissions Coordinator:  ? ?I have insurance approval and a bed available for pt to admit to CIR today.  Dr. Jake Samples in agreement.  Dr. Carlis Abbott aware and in agreement.  Will let pt and TOC team know.  ? ?Estill Dooms, PT, DPT ?Admissions Coordinator ?[phone] ?03/16/22  ?10:07 AM ? ?

## 2022-03-16 NOTE — Plan of Care (Signed)
Pt is alert oriented x 4. Ambulatory with walker, +1 assist. Pt c/o pain to neck and body (tingling). PRN pain medication given with effective results and prn muscle relaxer as well. Pt has not had a bowel movement and has had poor intake due to poor appetite. Last bowel movement was the day prior to admission.  ? ? ?Problem: Education: ?Goal: Knowledge of General Education information will improve ?Description: Including pain rating scale, medication(s)/side effects and non-pharmacologic comfort measures ?Outcome: Progressing ?  ?Problem: Health Behavior/Discharge Planning: ?Goal: Ability to manage health-related needs will improve ?Outcome: Progressing ?  ?Problem: Clinical Measurements: ?Goal: Ability to maintain clinical measurements within normal limits will improve ?Outcome: Progressing ?Goal: Will remain free from infection ?Outcome: Progressing ?Goal: Diagnostic test results will improve ?Outcome: Progressing ?Goal: Respiratory complications will improve ?Outcome: Progressing ?Goal: Cardiovascular complication will be avoided ?Outcome: Progressing ?  ?Problem: Activity: ?Goal: Risk for activity intolerance will decrease ?Outcome: Progressing ?  ?Problem: Nutrition: ?Goal: Adequate nutrition will be maintained ?Outcome: Progressing ?  ?Problem: Coping: ?Goal: Level of anxiety will decrease ?Outcome: Progressing ?  ?Problem: Elimination: ?Goal: Will not experience complications related to bowel motility ?Outcome: Progressing ?Goal: Will not experience complications related to urinary retention ?Outcome: Progressing ?  ?Problem: Pain Managment: ?Goal: General experience of comfort will improve ?Outcome: Progressing ?  ?Problem: Safety: ?Goal: Ability to remain free from injury will improve ?Outcome: Progressing ?  ?Problem: Skin Integrity: ?Goal: Risk for impaired skin integrity will decrease ?Outcome: Progressing ?  ?

## 2022-03-16 NOTE — H&P (Signed)
? ? ?Physical Medicine and Rehabilitation Admission H&P ? ?  ?CC:  Functional deficits due to cervical myelopathy.  ? ?HPI:  Alexandra Schmitt is a 41 year old female with history of ADHD, depression/anxiety d/o, fall 02/12/24 followed by BUE weakness, numbness, tingling and difficulty walking. She was evaluated by Dr. 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 She was admitted on 03/13/22 for ACDF C4-C6 with cervical collar to be worn at all times. PT/OT ongoing and patient limited by balance deficits with unsteady gait, sensory deficits BUE/BLE, weakness as well as neck/shoulder pain. CIR recommended due to recent functional decline.  Currently has incisional pain ? ? ?Review of Systems  ?Constitutional: Negative.   ?HENT: Negative.    ?Eyes: Negative.   ?Cardiovascular: Negative.   ?Gastrointestinal: Negative.   ?Genitourinary: Negative.   ?Musculoskeletal:  Positive for joint pain and myalgias.  ?Skin: Negative.   ?Neurological:  Positive for sensory change and weakness.  ?Endo/Heme/Allergies: Negative.   ? ? ?Past Medical History:  ?Diagnosis Date  ? ADHD (attention deficit hyperactivity disorder)   ? Anxiety   ? ? ?Past Surgical History:  ?Procedure Laterality Date  ? ANTERIOR CERVICAL DECOMP/DISCECTOMY FUSION N/A 03/13/2022  ? Procedure: Cervical four-five, Cervical five-six Anterior Cervical Decompression Discectomy Fusion;  Surgeon: Dawley, Theodoro Doing, DO;  Location: [place];  Service: Neurosurgery;  Laterality: N/A;  ? BROKEN NOSE    ? RIGHT FOOT RECONSTRUCTION    ? TONSILLECTOMY    ? ? ?Family History  ?Problem Relation Age of Onset  ? Hypertension Father   ? Cancer Maternal Grandmother   ?     LUNG  ? Cancer Maternal Grandfather   ?     LUNG  ? Cancer Paternal Grandfather   ?     LUNG  ? ? ?Social History: Lives alone and independent PTA.  reports that she has quit smoking. She quit smokeless tobacco use about 7 years ago. She reports current  alcohol use. She reports that she does not use drugs. ? ? ?Allergies: No Known Allergies ? ? ?Medications Prior to Admission  ?Medication Sig Dispense Refill  ? ALPRAZolam (XANAX) 0.5 MG tablet Take 0.5 mg by mouth at bedtime as needed for sleep or anxiety.    ? Carboxymethylcellulose Sodium (LUBRICANT EYE DROPS OP) Place 1 drop into both eyes daily as needed (dry eyes).    ? ibuprofen (ADVIL) 200 MG tablet Take 400 mg by mouth every 6 (six) hours as needed for headache or moderate pain.    ? ? ?Cognition ?Arousal/Alertness: Awake/alert ?Behavior During Therapy: [facility] for tasks assessed/performed ?Overall Cognitive Status: Within Functional Limits for tasks assessedHome: ?Home Living ?Family/patient expects to be discharged to:: Private residence ?Living Arrangements: Alone ?Available Help at Discharge: Family, Available PRN/intermittently ?Type of Home: House ?Home Access: Stairs to enter ?Entrance Stairs-Number of Steps: 3 ?Entrance Stairs-Rails: None ?Home Layout: Two level, Bed/bath upstairs ?Bathroom Shower/Tub: Tub/shower unit, Curtain ?Bathroom Toilet: Standard ?Home Equipment: Conservation officer, nature (2 wheels) ?  ?Functional History: ?Prior Function ?Prior Level of Function : Independent/Modified Independent ?Mobility Comments: RW since about 4/1 ?ADLs Comments: increased time, difficulty with buttoning (fine motor skills), reports bathing sinkside, friend has assisted pt to use walk-in shower at her home 1-2x since fall ?  ?Functional Status:  ?Mobility: ?Bed Mobility ?Overal bed mobility: Needs Assistance ?Bed Mobility: Sidelying to Sit ?Sidelying to sit: Min guard ?General bed mobility comments: sitting on side of bed  when PT arrived ?Transfers ?Overall transfer level: Needs assistance ?Equipment used: Rolling walker (2 wheels), 1 person hand held assist ?Transfers: Sit to/from Stand ?Sit to Stand: Min assist ?General transfer comment: requires minor assist and cues for intial complete standing and capture of  balance, with numbness on hands and LE's hindering the effort ?Ambulation/Gait ?Ambulation/Gait assistance: Min assist, +2 physical assistance, +2 safety/equipment (VERY close chair guard) ?Gait Distance (Feet): 60 Feet ?Assistive device: Rolling walker (2 wheels), 1 person hand held assist, Pushed wheelchair, 2 person hand held assist ?Gait Pattern/deviations: Step-through pattern, Decreased stride length, Wide base of support (requires cues for direction and use of walker) ?General Gait Details: pt is mildly unsteady but has poor localization of touch on feet and lower legs ?Gait velocity: reduced ?Gait velocity interpretation: <1.31 ft/sec, indicative of household ambulator ?Pre-gait activities: standing balance and postural correction ?  ?ADL: ?ADL ?Overall ADL's : Needs assistance/impaired ?Eating/Feeding: With adaptive utensils, Sitting, Minimal assistance ?Grooming: Minimal assistance, With adaptive equipment, Sitting ?Upper Body Bathing: Minimal assistance, Sitting ?Lower Body Bathing: Maximal assistance, Moderate assistance, Cueing for back precautions ?Upper Body Dressing : Minimal assistance, Sitting ?Lower Body Dressing: Maximal assistance, Sit to/from stand, Sitting/lateral leans ?Lower Body Dressing Details (indicate cue type and reason): difficulty performing figure 4 ?Toilet Transfer: Min guard, Rolling walker (2 wheels), Comfort height toilet ?Toilet Transfer Details (indicate cue type and reason): with BSC over toilet ?Toileting- Clothing Manipulation and Hygiene: Supervision/safety, Sitting/lateral lean ?Functional mobility during ADLs: Min guard, Rolling walker (2 wheels) ?  ?Cognition: ?Cognition ?Overall Cognitive Status: Within Functional Limits for tasks assessed ?Orientation Level: Oriented X4 ?Cognition ?Arousal/Alertness: Awake/alert ?Behavior During Therapy: [facility] for tasks assessed/performed ?Overall Cognitive Status: Within Functional Limits for tasks assessed ?  ? ? ?Blood pressure  121/70, pulse 86, temperature 98.2 ?F (36.8 ?C), resp. rate 18, height 5\' 6"  (1.676 m), weight 108 kg, last menstrual period 03/07/2022, SpO2 97 %. ?Physical Exam ?Gen: no distress, normal appearing, family at bedside, BMI 38.41 ?HEENT: oral mucosa pink and moist, cervical collar in place.  ?Cardio: Reg rate ?Chest: normal effort, normal rate of breathing ?Abd: soft, non-distended ?Ext: no edema ?Psych: pleasant, normal affect ?Skin: cervical incision c/d/i ?Neuro/Musculoskeletal: Alert and oriented x4. Cognition intact. Decreased sensation throughout all extremities. 4/5 strength throughout.  ? ?No results found for this or any previous visit (from the past 48 hour(s)). ?No results found. ? ? ? ?Blood pressure 121/70, pulse 86, temperature 98.2 ?F (36.8 ?C), resp. rate 18, height 5\' 6"  (1.676 m), weight 108 kg, last menstrual period 03/07/2022, SpO2 97 %. ? ?Medical Problem List and Plan: ?1. Functional deficits secondary to cervical myelopathy ? -patient may shower but incision must be covered ? -ELOS/Goals: modI 10-14 days  ? -Admit to CIR ?2.  Antithrombotics: ?-DVT/anticoagulation:  Mechanical: Sequential compression devices, below knee Bilateral lower extremities ? -antiplatelet therapy: N/A ?3. Incisional pain: continue Oxycodone prn ?4. Mood: LCSW to follow for evaluation and support.  ? -antipsychotic agents: N/A ?5. Neuropsych: This patient is capable of making decisions on her own behalf. ?6. Skin/Wound Care: Routine pressure relief measures.  ?7. Fluids/Electrolytes/Nutrition: Monitor I/O. Check CMET in am ?8. ADHD/Anxiety: In the past-->no meds for years. ?9.  Myalgias: add kpad ?19. Morbid obesity: BMI 38.41: provide dietary education ? ?I have personally performed a face to face diagnostic evaluation, including, but not limited to relevant history and physical exam findings, of this patient and developed relevant assessment and plan.  Additionally, I have reviewed and concur with the  physician  assistant's documentation above. ? ?Reesa Chew, PA-C ? ?Izora Ribas, MD ?03/16/2022  ?

## 2022-03-16 NOTE — Progress Notes (Signed)
Inpatient Rehabilitation Admission Medication Review by a Pharmacist ? ?A complete drug regimen review was completed for this patient to identify any potential clinically significant medication issues. ? ?High Risk Drug Classes Is patient taking? Indication by Medication  ?Antipsychotic Yes Compazine prn - nausea and vomiting  ?Anticoagulant No   ?Antibiotic No   ?Opioid Yes Oxycodone prn - pain  ?Antiplatelet No   ?Hypoglycemics/insulin No   ?Vasoactive Medication No   ?Chemotherapy No   ?Other Yes Alprazolam prn - anxiety or sleep ?Mupirocin nasal 5 day course begun 5/2, to be completed - MRSA PCR positive for Staph, negative for MRSA.   ? ? ? ?Type of Medication Issue Identified Description of Issue Recommendation(s)  ?Drug Interaction(s) (clinically significant) ?    ?Duplicate Therapy ?    ?Allergy ?    ?No Medication Administration End Date ?    ?Incorrect Dose ?    ?Additional Drug Therapy Needed ?    ?Significant med changes from prior encounter (inform family/care partners about these prior to discharge). No chronic medications prior to admission.   ?Other ? Has been using prn Methocarbamol 500 mg q6h prn (twice today so far). And has been on Docusate 100 mg PO BID. Secure chat to Dr. Ranell Patrick, both meds have now been continued.  ? ? ?Clinically significant medication issues were identified that warrant physician communication and completion of prescribed/recommended actions by midnight of the next day:  Yes ? ?Name of provider notified for urgent issues identified: Dr. Ranell Patrick ? ?Provider Method of Notification: secure chat ? ?Time spent performing this drug regimen review (minutes):  15 ? ?Arty Baumgartner, [place] ?03/16/2022 3:53 PM ?

## 2022-03-16 NOTE — Progress Notes (Signed)
PMR Admission Coordinator Pre-Admission Assessment ?  ?Patient: Alexandra Schmitt is an 41 y.o., female ?MRN: [id number] ?DOB: November 24, 1980 ?Height: _0  (167.6 cm) ?Weight: 108 kg ?  ?Insurance Information ?HMO: yes    PPO:      PCP:      IPA:      80/20:      OTHER:  ?PRIMARY: [employer]      Policy#: [id number]      Subscriber: pt ?CM Name: Mickel Baas      Phone#: [phone] ext [phone]     Fax#: [phone] ?Pre-Cert#: [id number] Josem Kaufmann for CIR from Mickel Baas at [place] with updates due to Gaspar Garbe at fax listed above on 5/10 (phone [phone] ext [phone])      Employer:  ?Benefits:  Phone #: [phone]     Name:  ?Eff. Date: 11/12/21     Deduct: $2750 (met 232.20)      Out of Pocket Max: $6250 (met $232.20)      Life Max: n/a ?CIR: 80%      SNF: 80% ?Outpatient: 80%     Co-Ins: 20% ?Home Health: 100%      Co-Pay:  ?DME: 100%     Co-Pay:  ?Providers:  ?SECONDARY:       Policy#:      Phone#:  ?  ?Financial Counselor:       Phone#:  ?  ?The ?Data Collection Information Summary? for patients in Inpatient Rehabilitation Facilities with attached ?Privacy Act [place] Records? was provided and verbally reviewed with: N/A ?  ?Emergency Contact Information ?Contact Information   ?  ?  Name Relation Home Work Mobile  ?  Schmitt,Alexandra Father     [phone]  ?  ?   ?  ?  ?Current Medical History  ?Patient Admitting Diagnosis: cervical myelopathy  ?History of Present Illness: Alexandra Schmitt is a 41 year old female with history of ADHD, depression/anxiety d/o, fall 02/12/24 followed by BUE weakness, numbness, tingling and difficulty walking, which progressed over time. She was evaluated by Dr. 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 She was admitted on 03/13/22 for ACDF C4-C6 with cervical collar to be worn at all times. PT/OT ongoing and patient limited by balance deficits with unsteady gait, sensory deficits BUE/BLE, weakness as well as  neck/shoulder pain. CIR recommended due to recent functional decline.   ?  ?Patient's [facility] record from [facility] has been reviewed by the rehabilitation admission coordinator and physician. ?  ?Past Medical History  ?    ?Past Medical History:  ?Diagnosis Date  ? ADHD (attention deficit hyperactivity disorder)    ? Anxiety    ?  ?  ?Has the patient had major surgery during 100 days prior to admission? Yes ?  ?Family History   ?family history includes Cancer in her maternal grandfather, maternal grandmother, and paternal grandfather; Hypertension in her father. ?  ?Current Medications ?  ?Current Facility-Administered Medications:  ?  0.9 %  sodium chloride infusion, 250 mL, Intravenous, Continuous, Dawley, Troy C, DO, Last Rate: 1 mL/hr at 03/13/22 1806, 250 mL at 03/13/22 1806 ?  acetaminophen (TYLENOL) tablet 650 mg, 650 mg, Oral, Q4H PRN **OR** acetaminophen (TYLENOL) suppository 650 mg, 650 mg, Rectal, Q4H PRN, Dawley, Troy C, DO ?  ALPRAZolam (XANAX) tablet 0.5 mg, 0.5 mg, Oral, QHS PRN, Dawley, Troy C, DO ?  Chlorhexidine Gluconate Cloth 2 % PADS 6 each, 6 each, Topical,  [id number], Dawley, Troy C, DO, 6 each at 03/15/22 1500 ?  docusate sodium (COLACE) capsule 100 mg, 100 mg, Oral, BID, Dawley, Troy C, DO, 100 mg at 03/15/22 1003 ?  HYDROmorphone (DILAUDID) injection 0.5 mg, 0.5 mg, Intravenous, Q3H PRN, Dawley, Troy C, DO ?  lactated ringers infusion, , Intravenous, Continuous, Dawley, Troy C, DO, Stopping previously hung infusion at 03/13/22 1500 ?  menthol-cetylpyridinium (CEPACOL) lozenge 3 mg, 1 lozenge, Oral, PRN **OR** phenol (CHLORASEPTIC) mouth spray 1 spray, 1 spray, Mouth/Throat, PRN, Dawley, Troy C, DO ?  methocarbamol (ROBAXIN) tablet 500 mg, 500 mg, Oral, Q6H PRN, 500 mg at 03/15/22 1254 **OR** methocarbamol (ROBAXIN) 500 mg in dextrose 5 % 50 mL IVPB, 500 mg, Intravenous, Q6H PRN, Dawley, Troy C, DO ?  mupirocin ointment (BACTROBAN) 2 % 1 application., 1 application., Nasal, BID, Dawley, Troy C,  DO, 1 application. at 03/15/22 0959 ?  ondansetron (ZOFRAN) tablet 4 mg, 4 mg, Oral, Q6H PRN **OR** ondansetron (ZOFRAN) injection 4 mg, 4 mg, Intravenous, Q6H PRN, Dawley, Troy C, DO ?  oxyCODONE (Oxy IR/ROXICODONE) immediate release tablet 10 mg, 10 mg, Oral, Q3H PRN, Dawley, Troy C, DO, 10 mg at 03/15/22 1254 ?  oxyCODONE (Oxy IR/ROXICODONE) immediate release tablet 5 mg, 5 mg, Oral, Q3H PRN, Dawley, Troy C, DO, 5 mg at 03/14/22 1858 ?  sodium chloride flush (NS) 0.9 % injection 3 mL, 3 mL, Intravenous, Q12H, Dawley, Troy C, DO, 3 mL at 03/14/22 2353 ?  sodium chloride flush (NS) 0.9 % injection 3 mL, 3 mL, Intravenous, PRN, Dawley, Troy C, DO ?  ?Patients Current Diet:  ?Diet Order   ?  ?         ?    DIET DYS 3 Room service appropriate? Yes; Fluid consistency: Thin  Diet effective now       ?  ?  ?   ?  ?  ?   ?  ?  ?Precautions / Restrictions ?Precautions ?Precautions: Cervical ?Precaution Booklet Issued: No ?Precaution Comments: verbally reviewed cervical precautions with pt ?Cervical Brace: Hard collar, At all times ?Restrictions ?Weight Bearing Restrictions: No  ?  ?Has the patient had 2 or more falls or a fall with injury in the past year? Yes ?  ?Prior Activity Level ?Community (5-7x/wk): independent, working at [employer], driving, no limitations. ?  ?Prior Functional Level ?Self Care: Did the patient need help bathing, dressing, using the toilet or eating? Independent ?  ?Indoor Mobility: Did the patient need assistance with walking from room to room (with or without device)? Independent ?  ?Stairs: Did the patient need assistance with internal or external stairs (with or without device)? Independent ?  ?Functional Cognition: Did the patient need help planning regular tasks such as shopping or remembering to take medications? Independent ?  ?Patient Information ?Are you of Hispanic, Latino/a,or Spanish origin?: A. No, not of Hispanic, Latino/a, or Spanish origin ?What is your race?: A. White ?Do you need or  want an interpreter to communicate with a doctor or health care staff?: 0. No ?  ?Patient's Response To:  ?Health Literacy and Transportation ?Is the patient able to respond to health literacy and transportation needs?: Yes ?Health Literacy - How often do you need to have someone help you when you read instructions, pamphlets, or other written material from your doctor or pharmacy?: Never ?In the past 12 months, has lack of transportation kept you from medical appointments or from getting medications?: No ?In the past 12 months, has lack  of transportation kept you from meetings, work, or from getting things needed for daily living?: No ?  ?Home Assistive Devices / Equipment ?Home Assistive Devices/Equipment: Gilford Rile (specify type) ?Home Equipment: Conservation officer, nature (2 wheels) ?  ?Prior Device Use: Indicate devices/aids used by the patient prior to current illness, exacerbation or injury? None of the above ?  ?Current Functional Level ?Cognition ?  Overall Cognitive Status: Within Functional Limits for tasks assessed ?Orientation Level: Oriented X4 ?   ?Extremity Assessment ?(includes Sensation/Coordination) ?  Upper Extremity Assessment: Defer to OT evaluation ?RUE Deficits / Details: grasp 3/5 ?RUE Sensation: decreased light touch ?RUE Coordination: decreased fine motor, decreased gross motor ?LUE Deficits / Details: grasp 3/5 ?LUE Sensation: decreased light touch ?LUE Coordination: decreased fine motor, decreased gross motor  ?Lower Extremity Assessment: Generalized weakness  ?   ?ADLs ?  Overall ADL's : Needs assistance/impaired ?Eating/Feeding: With adaptive utensils, Sitting, Minimal assistance ?Grooming: Minimal assistance, With adaptive equipment, Sitting ?Upper Body Bathing: Minimal assistance, Sitting ?Lower Body Bathing: Maximal assistance, Moderate assistance, Cueing for back precautions ?Upper Body Dressing : Minimal assistance, Sitting ?Lower Body Dressing: Maximal assistance, Sit to/from stand,  Sitting/lateral leans ?Lower Body Dressing Details (indicate cue type and reason): difficulty performing figure 4 ?Toilet Transfer: Min guard, Rolling walker (2 wheels), Comfort height toilet ?Armed forces technical officer Details (i

## 2022-03-16 NOTE — Progress Notes (Signed)
Patient arrived on unit, oriented to unit. Reviewed medications, therapy schedule, rehab routine and plan of care. States an understanding of information reviewed. No complications noted at this time. Patient is AX4 ?Alexandra Schmitt L Alexandra Schmitt   ?

## 2022-03-16 NOTE — Discharge Summary (Signed)
Physician Discharge Summary  ?Patient ID: ?Alexandra Schmitt ?MRN: [id number] ?DOB/AGE: March 09, 1981 41 y.o. ? ?Admit date: 03/13/2022 ?Discharge date: 03/16/2022 ? ?Admission Diagnoses: Cervical spondylosis with myelopathy, severe stenosis C4-5, C5-6 with cord signal change, fall with spinal cord injury ? ?Discharge Diagnoses: Cervical spondylosis with myelopathy, severe stenosis C4-5, C5-6 with cord signal change, fall with spinal cord injury ?Principal Problem: ?  Cervical myelopathy ([place]) ? ? ?Discharged Condition: good ? ?Hospital Course: The patient was admitted on 03/13/2022 and taken to the operating room where the patient underwent ACDF C4/5, C5/6. The patient tolerated the procedure well and was taken to the recovery room and then to the floor in stable condition. The hospital course was routine. There were no complications. The wound remained clean dry and intact. Pt had appropriate neck/upper back soreness. No complaints of arm/leg pain or new N/T/W. The patient remained afebrile with stable vital signs, and tolerated a regular diet. The patient continued to increase activities, and pain was well controlled with oral pain medications. She is now appropriate for discharge to CIR.  ? ? ?Consults: None ? ?Significant Diagnostic Studies: radiology: X-Ray: intraoperative ? ? ?Treatments: surgery:  ?1. Arthrodesis C4-5, C5-6, anterior interbody technique  ?2. Placement of intervertebral biomechanical device C4-5, C5-6; Medtronic Titan titanium interbody 7 x 16 x 67mm at C4-5, 7 x 16 x 14 mm at C5-6 ?3. Placement of anterior instrumentation consisting of interbody plate and screws -39 mm Medtronic Zevo plate, 16 mm x 3.5 mm screws bilaterally at C4, C5, C6 ?4. Discectomy at C4-5, C5-6 for decompression of spinal cord and exiting nerve roots  ?5. Use of morselized bone allograft  ?6.  Use of autograft, same incision ?7. Use of intraoperative microscope ?8.  Intraoperative use of neuro monitoring, SSEPs, greater than 1  hour ? ?Discharge Exam: ?Blood pressure 121/70, pulse 86, temperature 98.2 ?F (36.8 ?C), resp. rate 18, height 5\' 6"  (1.676 m), weight 108 kg, last menstrual period 03/07/2022, SpO2 97 %. ?Physical Exam: Patient is awake, A/O X 4, conversant, and in good spirits. Eyes open spontaneously. They are in NAD and VSS. Doing well. Speech is fluent and appropriate. MAEW with good strength that is symmetric bilaterally.  BUE 4-/5 throughout, BLE 4-/5 throughout. Sensation to light touch is decreased in her BUE and BLE. PERLA, EOMI. CNs grossly intact. Dressing is clean dry intact. Incision is well approximated with no drainage, erythema, or edema. ? ? ?ACDF incision staples to be removed 7-10 days postop.  ? ? ?Disposition: Discharge disposition: [facility] Not Defined ? ? ? ? ? ? ?Discharge Instructions   ? ? Incentive spirometry RT   Complete by: As directed ?  ? ?  ? ?Allergies as of 03/16/2022   ?No Known Allergies ?  ? ?  ?Medication List  ?  ? ?TAKE these medications   ? ?ALPRAZolam 0.5 MG tablet ?Commonly known as: Duanne Moron ?Take 0.5 mg by mouth at bedtime as needed for sleep or anxiety. ?  ?ibuprofen 200 MG tablet ?Commonly known as: ADVIL ?Take 400 mg by mouth every 6 (six) hours as needed for headache or moderate pain. ?  ?LUBRICANT EYE DROPS OP ?Place 1 drop into both eyes daily as needed (dry eyes). ?  ? ?  ? ? Follow-up Information   ? ? Archivist at [facility] Follow up on 04/17/2022.   ?Specialty: Family Medicine ?Why: Your appointment is at 9 am. Please arrive early. Bring picture ID, current medications  and insurance card.  ?You will receive a new patient packet in the mail to complete. ?Contact information: ?[address], [address] ?[place] [phone] ?[phone] ? ?  ?  ? ?  ?  ? ?  ? ? ?Signed: ?Marvis Moeller, DNP, NP-C ?03/16/2022, 11:45 AM ? ? ?

## 2022-03-16 NOTE — H&P (Addendum)
? ? ?Physical Medicine and Rehabilitation Admission H&P ? ?  ?CC:  Functional deficits due to cervical myelopathy.  ? ?HPI:  Alexandra Schmitt is a 41 year old female with history of ADHD, depression/anxiety d/o, fall 02/12/24 followed by BUE weakness, numbness, tingling and difficulty walking. She was evaluated by Dr. 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 She was admitted on 03/13/22 for ACDF C4-C6 with cervical collar to be worn at all times. PT/OT ongoing and patient limited by balance deficits with unsteady gait, sensory deficits BUE/BLE, weakness as well as neck/shoulder pain. CIR recommended due to recent functional decline.  Currently has incisional pain ? ? ?Review of Systems  ?Constitutional: Negative.   ?HENT: Negative.    ?Eyes: Negative.   ?Cardiovascular: Negative.   ?Gastrointestinal: Negative.   ?Genitourinary: Negative.   ?Musculoskeletal:  Positive for joint pain and myalgias.  ?Skin: Negative.   ?Neurological:  Positive for sensory change and weakness.  ?Endo/Heme/Allergies: Negative.   ? ? ?Past Medical History:  ?Diagnosis Date  ? ADHD (attention deficit hyperactivity disorder)   ? Anxiety   ? ? ?Past Surgical History:  ?Procedure Laterality Date  ? ANTERIOR CERVICAL DECOMP/DISCECTOMY FUSION N/A 03/13/2022  ? Procedure: Cervical four-five, Cervical five-six Anterior Cervical Decompression Discectomy Fusion;  Surgeon: Dawley, Alan Mulder, DO;  Location: [facility] OR;  Service: Neurosurgery;  Laterality: N/A;  ? BROKEN NOSE    ? RIGHT FOOT RECONSTRUCTION    ? TONSILLECTOMY    ? ? ?Family History  ?Problem Relation Age of Onset  ? Hypertension Father   ? Cancer Maternal Grandmother   ?     LUNG  ? Cancer Maternal Grandfather   ?     LUNG  ? Cancer Paternal Grandfather   ?     LUNG  ? ? ?Social History: Lives alone and independent PTA.  reports that she has quit smoking. She quit smokeless tobacco use about 7 years ago. She reports current  alcohol use. She reports that she does not use drugs. ? ? ?Allergies: No Known Allergies ? ? ?Medications Prior to Admission  ?Medication Sig Dispense Refill  ? ALPRAZolam (XANAX) 0.5 MG tablet Take 0.5 mg by mouth at bedtime as needed for sleep or anxiety.    ? Carboxymethylcellulose Sodium (LUBRICANT EYE DROPS OP) Place 1 drop into both eyes daily as needed (dry eyes).    ? ibuprofen (ADVIL) 200 MG tablet Take 400 mg by mouth every 6 (six) hours as needed for headache or moderate pain.    ? ? ? Home: ?Home Living ?Family/patient expects to be discharged to:: Private residence ?Living Arrangements: Alone ?Available Help at Discharge: Family, Available PRN/intermittently ?Type of Home: House ?Home Access: Stairs to enter ?Entrance Stairs-Number of Steps: 3 ?Entrance Stairs-Rails: None ?Home Layout: Two level, Bed/bath upstairs ?Bathroom Shower/Tub: Tub/shower unit, Curtain ?Bathroom Toilet: Standard ?Home Equipment: Agricultural consultant (2 wheels) ?  ?Functional History: ?Prior Function ?Prior Level of Function : Independent/Modified Independent ?Mobility Comments: RW since about 4/1 ?ADLs Comments: increased time, difficulty with buttoning (fine motor skills), reports bathing sinkside, friend has assisted pt to use walk-in shower at her home 1-2x since fall ?  ?Functional Status:  ?Mobility: ?Bed Mobility ?Overal bed mobility: Needs Assistance ?Bed Mobility: Sidelying to Sit ?Sidelying to sit: Min guard ?General bed mobility comments: sitting on side of bed when PT arrived ?Transfers ?Overall transfer level: Needs assistance ?Equipment used: Rolling walker (2 wheels), 1 person  hand held assist ?Transfers: Sit to/from Stand ?Sit to Stand: Min assist ?General transfer comment: requires minor assist and cues for intial complete standing and capture of balance, with numbness on hands and LE's hindering the effort ?Ambulation/Gait ?Ambulation/Gait assistance: Min assist, +2 physical assistance, +2 safety/equipment (VERY close  chair guard) ?Gait Distance (Feet): 60 Feet ?Assistive device: Rolling walker (2 wheels), 1 person hand held assist, Pushed wheelchair, 2 person hand held assist ?Gait Pattern/deviations: Step-through pattern, Decreased stride length, Wide base of support (requires cues for direction and use of walker) ?General Gait Details: pt is mildly unsteady but has poor localization of touch on feet and lower legs ?Gait velocity: reduced ?Gait velocity interpretation: <1.31 ft/sec, indicative of household ambulator ?Pre-gait activities: standing balance and postural correction ?  ?ADL: ?ADL ?Overall ADL's : Needs assistance/impaired ?Eating/Feeding: With adaptive utensils, Sitting, Minimal assistance ?Grooming: Minimal assistance, With adaptive equipment, Sitting ?Upper Body Bathing: Minimal assistance, Sitting ?Lower Body Bathing: Maximal assistance, Moderate assistance, Cueing for back precautions ?Upper Body Dressing : Minimal assistance, Sitting ?Lower Body Dressing: Maximal assistance, Sit to/from stand, Sitting/lateral leans ?Lower Body Dressing Details (indicate cue type and reason): difficulty performing figure 4 ?Toilet Transfer: Min guard, Rolling walker (2 wheels), Comfort height toilet ?Toilet Transfer Details (indicate cue type and reason): with BSC over toilet ?Toileting- Clothing Manipulation and Hygiene: Supervision/safety, Sitting/lateral lean ?Functional mobility during ADLs: Min guard, Rolling walker (2 wheels) ?  ?Cognition: ?Cognition ?Overall Cognitive Status: Within Functional Limits for tasks assessed ?Orientation Level: Oriented X4 ?Cognition ?Arousal/Alertness: Awake/alert ?Behavior During Therapy: [facility] for tasks assessed/performed ?Overall Cognitive Status: Within Functional Limits for tasks assessed ?  ? ? ?Blood pressure (!) 141/90, pulse 80, temperature 97.9 ?F (36.6 ?C), resp. rate 16, last menstrual period 03/07/2022. ?Physical Exam ?Gen: no distress, normal appearing, family at bedside, BMI  38.41 ?HEENT: oral mucosa pink and moist, cervical collar in place.  ?Cardio: Reg rate ?Chest: normal effort, normal rate of breathing ?Abd: soft, non-distended ?Ext: no edema ?Psych: pleasant, normal affect ?Skin: cervical incision c/d/i ?Neuro/Musculoskeletal: Alert and oriented x4. Cognition intact. Decreased sensation throughout all extremities. 4/5 strength throughout.  ? ?No results found for this or any previous visit (from the past 48 hour(s)). ?No results found. ? ? ? ?Blood pressure (!) 141/90, pulse 80, temperature 97.9 ?F (36.6 ?C), resp. rate 16, last menstrual period 03/07/2022. ? ?Medical Problem List and Plan: ?1. Functional deficits secondary to cervical myelopathy ? -patient may shower but incision must be covered ? -ELOS/Goals: modI 10-14 days  ? -Admit to CIR ?2.  Antithrombotics: ?-DVT/anticoagulation:  Mechanical: Sequential compression devices, below knee Bilateral lower extremities ? -antiplatelet therapy: N/A ?3. Incisional pain: continue Oxycodone prn ?4. Mood: LCSW to follow for evaluation and support.  ? -antipsychotic agents: N/A ?5. Neuropsych: This patient is capable of making decisions on her own behalf. ?6. Skin/Wound Care: Routine pressure relief measures.  ?7. Fluids/Electrolytes/Nutrition: Monitor I/O. Check CMET in am ?8. ADHD/Anxiety: In the past-->no meds for years. ?9.  Myalgias: add kpad, continue prn robaxin.  ?10. Morbid obesity: BMI 38.41: provide dietary education ?11. Burning thoracic pain; lumbar MRI imaging suggestive of thoracic spine pedicle fractures: CT ordered to further assess.  ?12. Constipation: Senna 2 tabs HS ? ? ?I have personally performed a face to face diagnostic evaluation, including, but not limited to relevant history and physical exam findings, of this patient and developed relevant assessment and plan.  Additionally, I have reviewed and concur with the physician assistant's documentation above. ? ?Delle Reining,  PA-C ? ?Horton Chin, MD ?03/16/2022   ?

## 2022-03-16 NOTE — TOC Transition Note (Signed)
Transition of Care (TOC) - CM/SW Discharge Note ? ? ?Patient Details  ?Name: Alexandra Schmitt ?MRN: [id number] ?Date of Birth: [birth year] ? ?Transition of Care (TOC) CM/SW Contact:  ?Kermit Balo, RN ?Phone Number: ?03/16/2022, 10:53 AM ? ? ?Clinical Narrative:    ?Patient discharging to CIR today. CM signing off. ? ? ?Final next level of care: IP Rehab Facility ?Barriers to Discharge: No Barriers Identified ? ? ?Patient Goals and CMS Choice ?  ?CMS Medicare.gov Compare Post Acute Care list provided to:: Patient ?Choice offered to / list presented to : Patient ? ?Discharge Placement ?  ?           ?  ?  ?  ?  ? ?Discharge Plan and Services ?  ?Discharge Planning Services: CM Consult ?Post Acute Care Choice: IP Rehab          ?  ?  ?  ?  ?  ?  ?  ?  ?  ?  ? ?Social Determinants of Health (SDOH) Interventions ?  ? ? ?Readmission Risk Interventions ?   ? View : No data to display.  ?  ?  ?  ? ? ? ? ? ?

## 2022-03-17 ENCOUNTER — Inpatient Hospital Stay (HOSPITAL_COMMUNITY): Payer: Managed Care, Other (non HMO)

## 2022-03-17 DIAGNOSIS — G959 Disease of spinal cord, unspecified: Secondary | ICD-10-CM

## 2022-03-17 IMAGING — CT CT T SPINE W/O CM
3 of 4 series · 9 of 33 positions shown, 11 images · non-contrast
Comparison: Lumbar MRI [DATE]. Intraoperative cervical spine
radiographs [DATE].

CLINICAL DATA: 41-year-old female with fall. Pain. Question of T12
fracture.

EXAM:
CT THORACIC SPINE WITHOUT CONTRAST
TECHNIQUE: Multidetector CT images of the thoracic were obtained using the
standard protocol without intravenous contrast.
RADIATION DOSE REDUCTION: This exam was performed according to the
departmental dose-optimization program which includes automated
exposure control, adjustment of the mA and/or kV according to
patient size and/or use of iterative reconstruction technique.

[Series 3: t spine bone · axial · 0.36mm/px · z∈[-353,-353]mm · 1 of 169 slices shown, 2 images]
[im 85/169  soft-tissue]
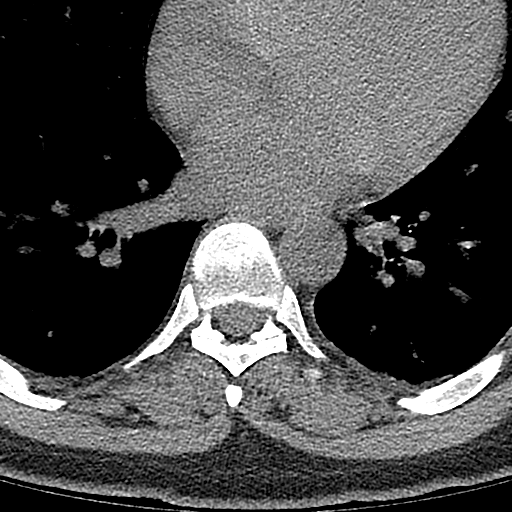
[im 85/169  bone]
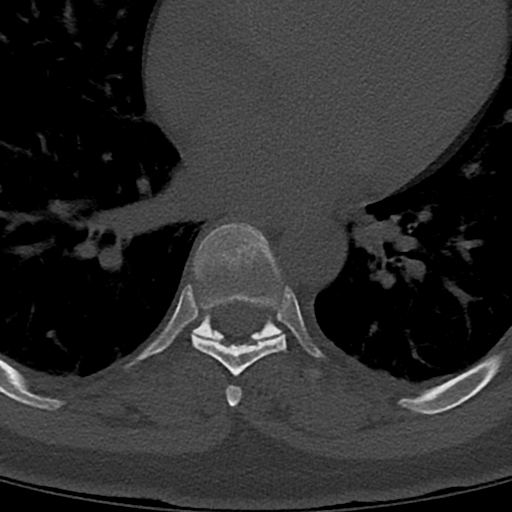

[Series 6: sag bone · sagittal · 0.41mm/px · 5 of 102 slices shown, 6 images]
[im 34/102  bone]
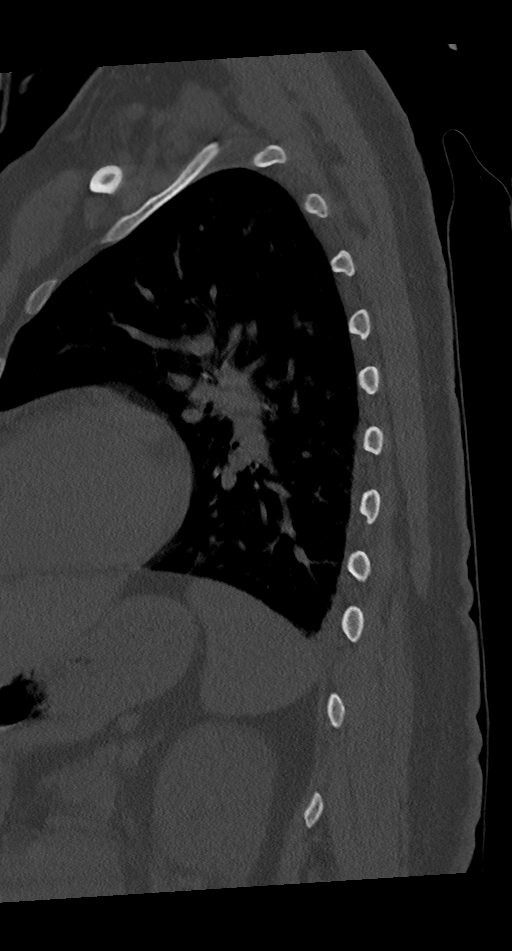
[im 43/102  bone]
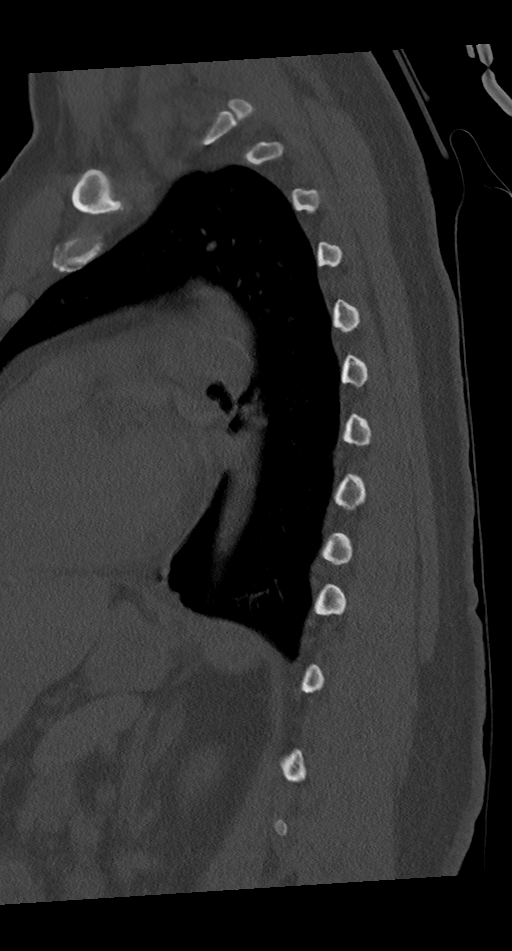
[im 51/102  soft-tissue]
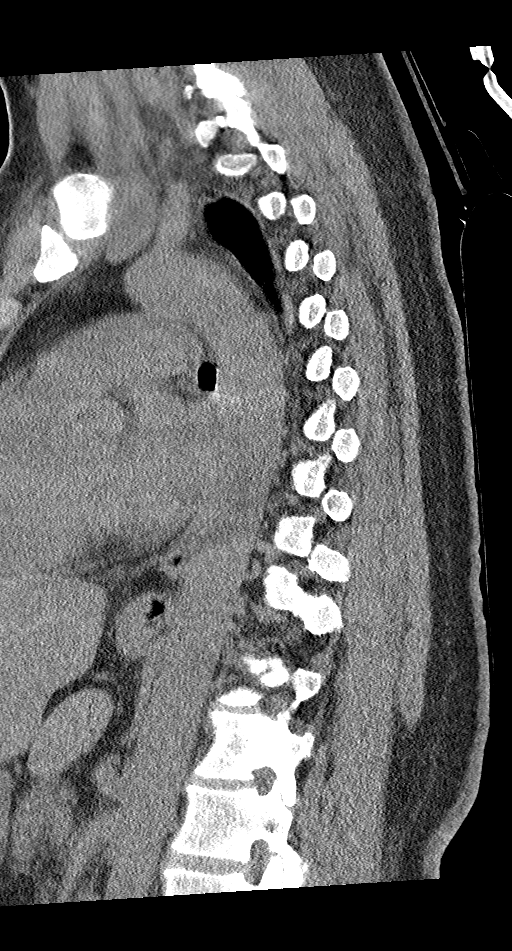
[im 51/102  bone]
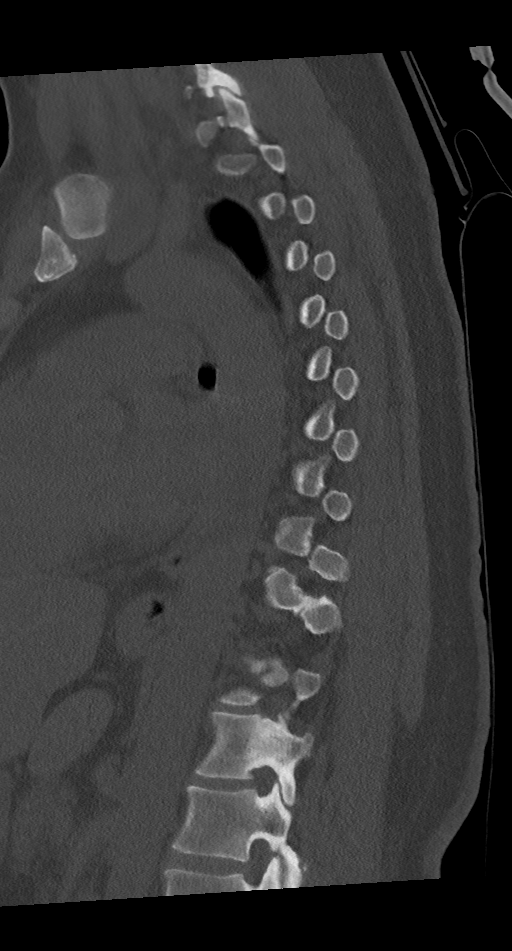
[im 59/102  bone]
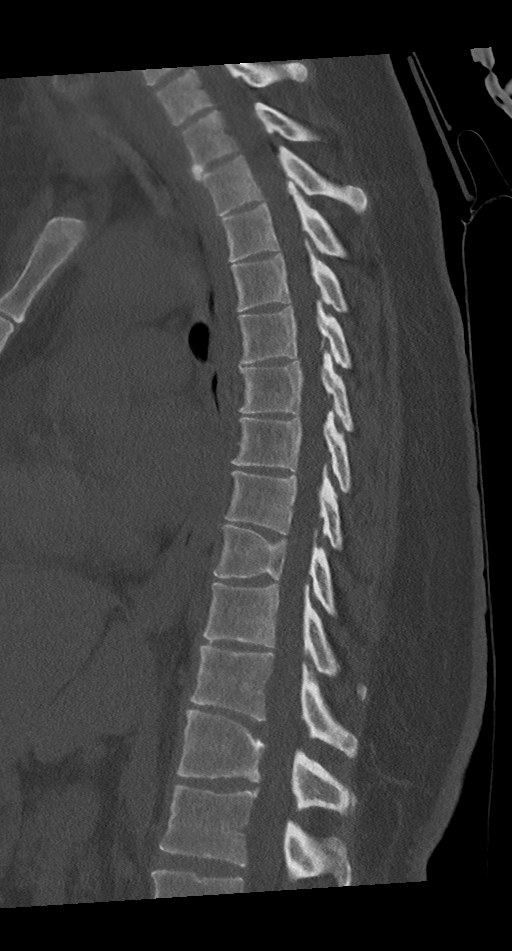
[im 68/102  bone]
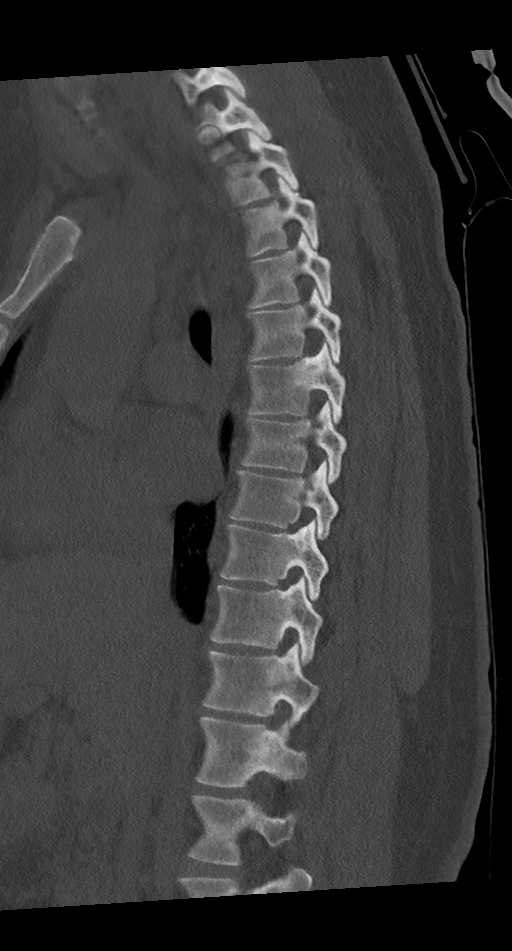

[Series 7: cor bone · coronal · 0.41mm/px · 3 of 78 slices shown]
[im 16/78  bone]
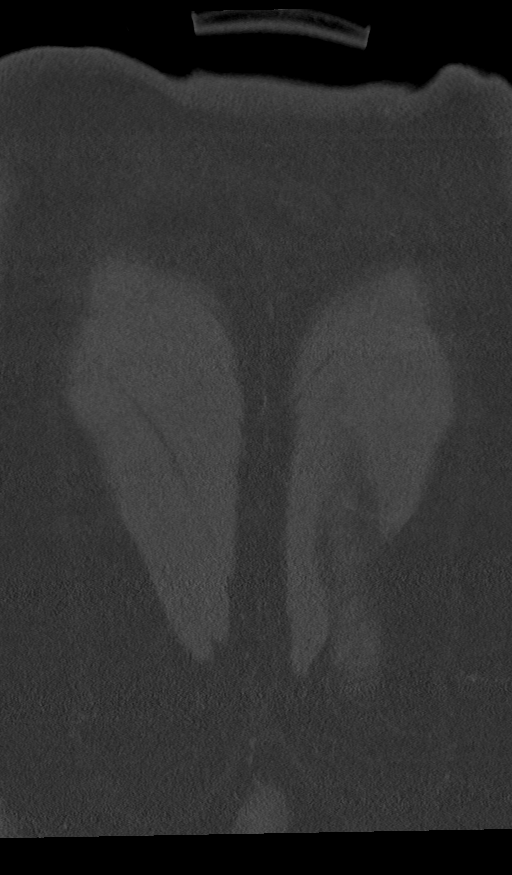
[im 31/78  bone]
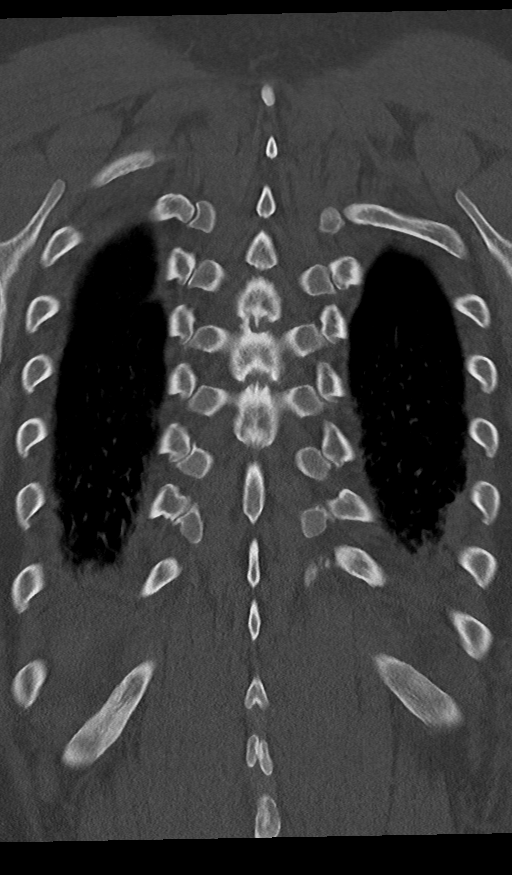
[im 47/78  bone]
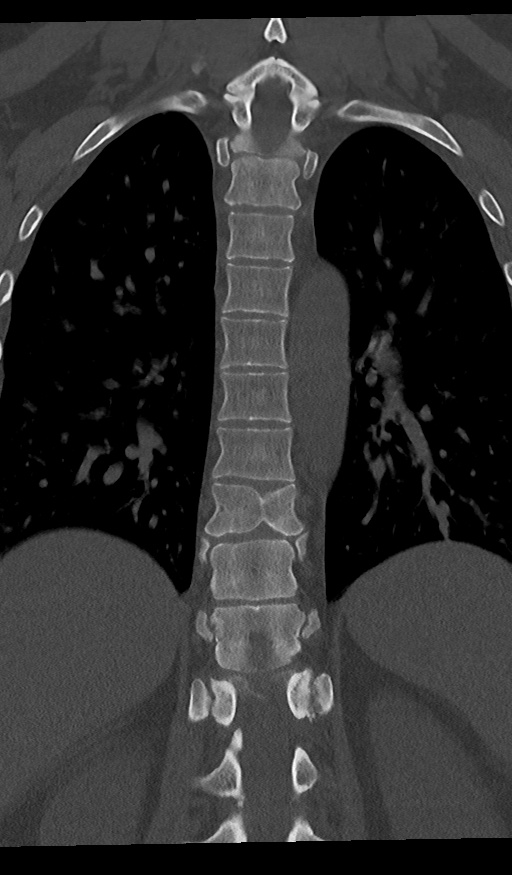

[9 of 33 positions shown; findings below may reference images not displayed]

FINDINGS: Limited cervical spine imaging: Cervicothoracic junction alignment
is within normal limits.

Thoracic spine segmentation: T9 and T12 butterfly vertebrae.
Otherwise normal.

Alignment: Relatively normal thoracic kyphosis. No
spondylolisthesis. Subtle dextroconvex thoracic scoliosis.

Vertebrae: Congenital T9 and T12 butterfly vertebrae. Background
bone mineralization within normal limits. No acute osseous
abnormality identified. There is degenerative appearing asymmetric
sclerosis of the T12 pedicles and costovertebral junctions greater
on the left.

Paraspinal and other soft tissues: Partially visible multi spatial
soft tissue inflammation and swelling just above the thoracic inlet
(series 4, image 7) eccentric to the right and likely associated
with the 3 level cervical ACDF (C4, C5, C6) depicted on the recent
intraoperative radiographs.

Minimal inflammation tracking along the tracheoesophageal groove
into the superior mediastinum. Otherwise thoracic paraspinal soft
tissues are within normal limits on this noncontrast exam.

No mediastinal hematoma. Small reactive appearing superior
mediastinal lymph nodes. Visible major airways are patent. Lower
lobe atelectasis left greater than right. No pleural effusion. No
pneumothorax is visible. No pericardial effusion. Negative visible
noncontrast upper abdominal viscera.

Disc levels:

Generally mild for age thoracic spine degeneration with the
following exceptions:

T2-T3: Moderate to severe ligament flavum hypertrophy which is
partially calcified. Mild to moderate facet hypertrophy. But no
significant spinal stenosis suspected.

T10-T11: Mild to moderate facet and ligament flavum hypertrophy
greater on the right. No spinal stenosis suspected.

T11-T12: Widespread disc bulging and mild endplate spurring. This
level visible on the [facility] MRI, with borderline spinal stenosis
depicted.

T12-L1: Mild disc bulging and endplate spurring. This level visible
on the [facility] MRI with no significant spinal stenosis.
IMPRESSION: 1. Partially visible postoperative soft tissue inflammation in the
lower neck status post recent cervical ACDF. Minimal associated
inflammation in the superior mediastinum.

2. No acute osseous abnormality in the thoracic spine. Congenital T9
and T12 butterfly vertebrae. And degenerative appearing sclerosis of
the bilateral T12 pedicles and costovertebral junctions.

3. Generally mild for age thoracic spine degeneration, however,
chronic severe ligament flavum degeneration at T2-T3, and disc
bulging with endplate spurring in the lower thoracic spine visible
on the lumbar MRI last month and resulting in borderline to mild
spinal stenosis at T11-T12.

## 2022-03-17 NOTE — Plan of Care (Signed)
?Problem: RH Balance ?Goal: LTG Patient will maintain dynamic standing with ADLs (OT) ?Description: LTG:  Patient will maintain dynamic standing balance with assist during activities of daily living (OT)  ?Flowsheets (Taken 03/17/2022 1529) ?LTG: Pt will maintain dynamic standing balance during ADLs with: Independent with assistive device ?  ?Problem: Sit to Stand ?Goal: LTG:  Patient will perform sit to stand in prep for activites of daily living with assistance level (OT) ?Description: LTG:  Patient will perform sit to stand in prep for activites of daily living with assistance level (OT) ?Flowsheets (Taken 03/17/2022 1529) ?LTG: PT will perform sit to stand in prep for activites of daily living with assistance level: Independent with assistive device ?  ?Problem: RH Eating ?Goal: LTG Patient will perform eating w/assist, cues/equip (OT) ?Description: LTG: Patient will perform eating with assist, with/without cues using equipment (OT) ?Flowsheets (Taken 03/17/2022 1529) ?LTG: Pt will perform eating with assistance level of: Independent ?  ?Problem: RH Grooming ?Goal: LTG Patient will perform grooming w/assist,cues/equip (OT) ?Description: LTG: Patient will perform grooming with assist, with/without cues using equipment (OT) ?Flowsheets (Taken 03/17/2022 1529) ?LTG: Pt will perform grooming with assistance level of: Independent with assistive device  ?  ?Problem: RH Bathing ?Goal: LTG Patient will bathe all body parts with assist levels (OT) ?Description: LTG: Patient will bathe all body parts with assist levels (OT) ?Flowsheets (Taken 03/17/2022 1529) ?LTG: Pt will perform bathing with assistance level/cueing: Independent with assistive device  ?  ?Problem: RH Dressing ?Goal: LTG Patient will perform upper body dressing (OT) ?Description: LTG Patient will perform upper body dressing with assist, with/without cues (OT). ?Flowsheets (Taken 03/17/2022 1529) ?LTG: Pt will perform upper body dressing with assistance level of:  Independent with assistive device ?Goal: LTG Patient will perform lower body dressing w/assist (OT) ?Description: LTG: Patient will perform lower body dressing with assist, with/without cues in positioning using equipment (OT) ?Flowsheets (Taken 03/17/2022 1529) ?LTG: Pt will perform lower body dressing with assistance level of: Independent with assistive device ?  ?Problem: RH Toileting ?Goal: LTG Patient will perform toileting task (3/3 steps) with assistance level (OT) ?Description: LTG: Patient will perform toileting task (3/3 steps) with assistance level (OT)  ?Flowsheets (Taken 03/17/2022 1529) ?LTG: Pt will perform toileting task (3/3 steps) with assistance level: Independent with assistive device ?  ?Problem: RH Functional Use of Upper Extremity ?Goal: LTG Patient will use RT/LT upper extremity as a (OT) ?Description: LTG: Patient will use right/left upper extremity as a stabilizer/gross assist/diminished/nondominant/dominant level with assist, with/without cues during functional activity (OT) ?Flowsheets (Taken 03/17/2022 1529) ?LTG: Use of upper extremity in functional activities: RUE as dominant level ?LTG: Pt will use upper extremity in functional activity with assistance level of: Independent ?  ?Problem: RH Simple Meal Prep ?Goal: LTG Patient will perform simple meal prep w/assist (OT) ?Description: LTG: Patient will perform simple meal prep with assistance, with/without cues (OT). ?Flowsheets (Taken 03/17/2022 1529) ?LTG: Pt will perform simple meal prep with assistance level of: Independent with assistive device ?  ?Problem: RH Light Housekeeping ?Goal: LTG Patient will perform light housekeeping w/assist (OT) ?Description: LTG: Patient will perform light housekeeping with assistance, with/without cues (OT). ?Flowsheets (Taken 03/17/2022 1529) ?LTG: Pt will perform light housekeeping with assistance level of: Independent with assistive device ?  ?Problem: RH Toilet Transfers ?Goal: LTG Patient will perform  toilet transfers w/assist (OT) ?Description: LTG: Patient will perform toilet transfers with assist, with/without cues using equipment (OT) ?Flowsheets (Taken 03/17/2022 1529) ?LTG: Pt will perform toilet transfers  with assistance level of: Independent with assistive device ?  ?Problem: RH Tub/Shower Transfers ?Goal: LTG Patient will perform tub/shower transfers w/assist (OT) ?Description: LTG: Patient will perform tub/shower transfers with assist, with/without cues using equipment (OT) ?Flowsheets (Taken 03/17/2022 1529) ?LTG: Pt will perform tub/shower stall transfers with assistance level of: Independent with assistive device ?  ?

## 2022-03-17 NOTE — Evaluation (Signed)
Physical Therapy Assessment and Plan ? ?Patient Details  ?Name: Alexandra Schmitt ?MRN: [id number] ?Date of Birth: 25-Apr-1981 ? ?PT Diagnosis: Coordination disorder, Difficulty walking, Impaired sensation, Low back pain, and Muscle weakness ?Rehab Potential: Good ?ELOS: 10-14 days  ? ?Today's Date: 03/17/2022 ?PT Individual Time: 1000-1108, 1400-1445 ?PT Individual Time Calculation (min): 68 min , 45 min  ? ?Hospital Problem: Principal Problem: ?  Cervical myelopathy ([place]) ? ? ?Past Medical History:  ?Past Medical History:  ?Diagnosis Date  ? ADHD (attention deficit hyperactivity disorder)   ? Anxiety   ? ?Past Surgical History:  ?Past Surgical History:  ?Procedure Laterality Date  ? ANTERIOR CERVICAL DECOMP/DISCECTOMY FUSION N/A 03/13/2022  ? Procedure: Cervical four-five, Cervical five-six Anterior Cervical Decompression Discectomy Fusion;  Surgeon: Alexandra Schmitt;  Location: [place];  Service: Neurosurgery;  Laterality: N/A;  ? BROKEN NOSE    ? RIGHT FOOT RECONSTRUCTION    ? TONSILLECTOMY    ? ? ?Assessment & Plan ?Clinical Impression: Alexandra Schmitt is a 41 year old female with history of ADHD, depression/anxiety d/o, fall 02/12/24 followed by BUE weakness, numbness, tingling and difficulty walking. She was evaluated by Dr. Reatha Schmitt and found to have severe spinal stenosis C4-5 and C5-6 due to HNP with osteophyte and cord signal changes. She was admitted on 03/13/22 for ACDF C4-C6 with cervical collar to be worn at all times. PT/OT ongoing and patient limited by balance deficits with unsteady gait, sensory deficits BUE/BLE, weakness as well as neck/shoulder pain. CIR recommended due to recent functional decline.  Currently has incisional painPatient transferred to CIR on 03/16/2022 .    ? ?Patient currently requires min with mobility secondary to muscle weakness and muscle joint tightness, impaired timing and sequencing and decreased coordination, and decreased standing balance and decreased balance strategies.  Prior to  hospitalization, patient was independent  with mobility and lived with Alone in a Other(Comment) (condo) home.  Home access is 3Stairs to enter. ? ?Patient will benefit from skilled PT intervention to maximize safe functional mobility, minimize fall risk, and decrease caregiver burden for planned discharge home alone.  Anticipate patient will benefit from follow up OP at discharge. ? ?PT - End of Session ?Activity Tolerance: Tolerates 30+ min activity with multiple rests ?Endurance Deficit: Yes ?Endurance Deficit Description: Pt fatigues quickly ?PT Assessment ?Rehab Potential (ACUTE/IP ONLY): Good ?PT Barriers to Discharge: Inaccessible home environment;Decreased caregiver support;Neurogenic Bowel & Bladder;Wound Care;Lack of/limited family support ?PT Patient demonstrates impairments in the following area(s): Balance;Pain;Safety;Endurance;Motor;Edema ?PT Transfers Functional Problem(s): Bed Mobility;Bed to Chair;Car;Furniture ?PT Locomotion Functional Problem(s): Ambulation;Stairs ?PT Plan ?PT Intensity: Minimum of 1-2 x/day ,45 to 90 minutes ?PT Frequency: 5 out of 7 days ?PT Duration Estimated Length of Stay: 10-14 days ?PT Treatment/Interventions: Ambulation/gait training;Discharge planning;DME/adaptive equipment instruction;Functional mobility training;Pain management;Psychosocial support;Splinting/orthotics;Therapeutic Activities;UE/LE Strength taining/ROM;Visual/perceptual remediation/compensation;Balance/vestibular training;Community reintegration;Disease management/prevention;Neuromuscular re-education;Functional electrical stimulation;Patient/family education;Skin care/wound Landscape architect;Therapeutic Exercise;UE/LE Coordination activities;Wheelchair propulsion/positioning ?PT Transfers Anticipated Outcome(s): mod I transfers ?PT Locomotion Anticipated Outcome(s): mod I gait ?PT Recommendation ?Recommendations for Other Services: Therapeutic Recreation consult;Neuropsych consult ?Therapeutic  Recreation Interventions: Stress management;Outing/community reintergration ?Follow Up Recommendations: Outpatient PT ?Patient destination: Home ?Equipment Recommended: Rolling walker with 5" wheels ? ? ?PT Evaluation ?Precautions/Restrictions ?Precautions ?Precautions: Cervical ?Precaution Comments: verbally reviewed cervical precautions with pt ?Required Braces or Orthoses: Cervical Brace ?Cervical Brace: Hard collar;At all times ?Restrictions ?Weight Bearing Restrictions: No ?General ?  Vital SignsTherapy Vitals ?Temp: 98.3 ?F (36.8 ?C) ?Temp Source: Oral ?Pulse Rate: 84 ?Resp: 20 ?BP: 104/86 ?Patient Position (if appropriate):  Sitting ?Oxygen Therapy ?SpO2: 97 % ?O2 Device: Room Air ?Pain ?Pain Assessment ?Pain Scale: 0-10 ?Pain Score: 4  ?Pain Interference ?  ?Home Living/Prior Functioning ?Home Living ?Available Help at Discharge: Family;Available PRN/intermittently ?Home Access: Stairs to enter ?Entrance Stairs-Number of Steps: 3 ?Entrance Stairs-Rails: None ?Home Layout: Two level;Bed/bath upstairs ?Bathroom Shower/Tub: Tub/shower unit;Curtain ?Bathroom Toilet: Standard ? Lives With: Alone ?Prior Function ?Level of Independence: Independent with gait;Independent with homemaking with ambulation;Independent with basic ADLs;Independent with transfers ? Able to Take Stairs?: Yes ?Driving: Yes ?Vocation: Full time employment ?Vision/Perception  ?Vision - History ?Ability to See in Adequate Light: 0 Adequate ?Perception ?Perception: Within Functional Limits ?Praxis ?Praxis: Intact  ?Cognition ?Overall Cognitive Status: Within Functional Limits for tasks assessed ?Arousal/Alertness: Awake/alert ?Memory: Appears intact ?Awareness: Appears intact ?Problem Solving: Appears intact ?Safety/Judgment: Appears intact ?Sensation ?Sensation ?Light Touch: Impaired Detail ?Peripheral sensation comments: impaired distal>proximl, inconsistent sensation deficits in thighs and bottoms of feet as well as L hand ulnar side ?Light  Touch Impaired Details: Impaired RLE;Impaired LLE;Impaired RUE;Impaired LUE ?Proprioception: Appears Intact ?Coordination ?Gross Motor Movements are Fluid and Coordinated: No ?Fine Motor Movements are Fluid and Coordinated: No ?Coordination and Movement Description: grossly uncoordinated 2/2 BUE/BLE weakness and sensation deficits ?Motor  ?Motor ?Motor: Other (comment) ?Motor - Skilled Clinical Observations: limited coordination 2/2 BUE weakness and sensation deficits  ? ?Trunk/Postural Assessment  ?Cervical Assessment ?Cervical Assessment: Exceptions to [facility] ?Thoracic Assessment ?Thoracic Assessment: Exceptions to [facility] ?Lumbar Assessment ?Lumbar Assessment: Within Functional Limits ?Postural Control ?Postural Control: Deficits on evaluation  ?Balance ?Balance ?Balance Assessed: Yes ?Standardized Balance Assessment ?Standardized Balance Assessment: Timed Up and Go Test ?Timed Up and Go Test ?TUG: Normal TUG ?Normal TUG (seconds): 32.9 ?Dynamic Sitting Balance ?Dynamic Sitting - Balance Support: Feet supported ?Dynamic Sitting - Level of Assistance: 5: Stand by assistance ?Dynamic Sitting - Balance Activities: Forward lean/weight shifting;Reaching for objects ?Sitting balance - Comments: can reach outside BOS without LOB ?Static Standing Balance ?Static Standing - Balance Support: During functional activity ?Static Standing - Level of Assistance: 5: Stand by assistance ?Dynamic Standing Balance ?Dynamic Standing - Balance Support: During functional activity ?Dynamic Standing - Level of Assistance: 4: Min assist ?Dynamic Standing - Balance Activities: Lateral lean/weight shifting;Reaching for objects ?Extremity Assessment  ?RUE Assessment ?RUE Assessment: Exceptions to [facility] ?Active Range of Motion (AROM) Comments: WFL, decreased coordination and mild ataxia ?General Strength Comments: roughly 4/5 strength, decreased grip strength limiting at 32# on R hand ?LUE Assessment ?LUE Assessment: Exceptions to [facility] ?Active Range  of Motion (AROM) Comments: WFL ?General Strength Comments: roughly 4/5 strength, decreased grip strength limiting at 15# on L hand ?RLE Assessment ?RLE Assessment: Exceptions to [facility] ?RLE Strength ?Right Hi

## 2022-03-17 NOTE — Progress Notes (Signed)
Occupational Therapy Session Note ? ?Patient Details  ?Name: Alexandra Schmitt ?MRN: [id number] ?Date of Birth: [birth year] ? ?Today's Date: 03/17/2022 ?OT Missed Time: 50 Minutes ?Missed Time Reason: CT/MRI ? ? ?Short Term Goals: ?Week 1:    ? ?Skilled Therapeutic Interventions/Progress Updates:  ?  Pt greeted at time of AM session for OT evaluation, discussion with pt regarding OT goals and POC. Note at this time transport tech present to take pt for CT scan 2/2 recent thoracic area pain. Missed 50 minutes of OT session 2/2 CT scan, will continue to follow and pick up time as able. ? ?Therapy Documentation ?Precautions:  ?Precautions ?Precautions: Cervical ?Precaution Booklet Issued: No ?Precaution Comments: verbally reviewed cervical precautions with pt ?Required Braces or Orthoses: Cervical Brace ?Cervical Brace: Hard collar, At all times (Need to confirm orders for brace) ?Restrictions ?Weight Bearing Restrictions: No ? ? ? ? ? ?Therapy/Group: Individual Therapy ? ?Erasmo Score ?03/17/2022, 3:11 PM ?

## 2022-03-17 NOTE — Evaluation (Signed)
Occupational Therapy Assessment and Plan ? ?Patient Details  ?Name: Alexandra Schmitt ?MRN: [id number] ?Date of Birth: [birth year] ? ?OT Diagnosis: abnormal posture, acute pain, ataxia, and muscle weakness (generalized) ?Rehab Potential:   ?ELOS: 10-12 days  ? ?Today's Date: 03/17/2022 ?OT Individual Time: [id number] ?OT Individual Time Calculation (min): 55 min    ? ?Hospital Problem: Principal Problem: ?  Cervical myelopathy ([place]) ? ? ?Past Medical History:  ?Past Medical History:  ?Diagnosis Date  ? ADHD (attention deficit hyperactivity disorder)   ? Anxiety   ? ?Past Surgical History:  ?Past Surgical History:  ?Procedure Laterality Date  ? ANTERIOR CERVICAL DECOMP/DISCECTOMY FUSION N/A 03/13/2022  ? Procedure: Cervical four-five, Cervical five-six Anterior Cervical Decompression Discectomy Fusion;  Surgeon: Dawley, Theodoro Doing, DO;  Location: [place];  Service: Neurosurgery;  Laterality: N/A;  ? BROKEN NOSE    ? RIGHT FOOT RECONSTRUCTION    ? TONSILLECTOMY    ? ? ?Assessment & Plan ?Clinical Impression: Alexandra Schmitt is a 41 year old female with history of ADHD, depression/anxiety d/o, fall 02/12/24 followed by BUE weakness, numbness, tingling and difficulty walking. She was evaluated by Dr. 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 She was admitted on 03/13/22 for ACDF C4-C6 with cervical collar to be worn at all times. PT/OT ongoing and patient limited by balance deficits with unsteady gait, sensory deficits BUE/BLE, weakness as well as neck/shoulder pain. CIR recommended due to recent functional decline.  Currently has incisional painPatient transferred to CIR on 03/16/2022 .   ? ?Patient currently requires min with basic self-care skills secondary to muscle weakness, decreased cardiorespiratoy endurance, impaired timing and sequencing, unbalanced muscle activation, ataxia, decreased coordination, and decreased motor planning, decreased motor planning, and  decreased sitting balance, decreased standing balance, decreased postural control, and decreased balance strategies.  Prior to hospitalization, patient could complete BADL with independent . ? ?Patient will benefit from skilled intervention to decrease level of assist with basic self-care skills, increase independence with basic self-care skills, and increase level of independence with iADL prior to discharge home independently.  Anticipate patient will require intermittent supervision and follow up outpatient. ? ?OT - End of Session ?Activity Tolerance: Tolerates 30+ min activity with multiple rests ?Endurance Deficit: Yes ?OT Assessment ?OT Barriers to Discharge: Home environment access/layout ?OT Patient demonstrates impairments in the following area(s): Balance;Endurance;Motor;Pain;Perception;Safety;Sensory ?OT Basic ADL's Functional Problem(s): Grooming;Eating;Bathing;Dressing;Toileting ?OT Advanced ADL's Functional Problem(s): Simple Meal Preparation;Light Housekeeping ?OT Transfers Functional Problem(s): Toilet;Tub/Shower ?OT Additional Impairment(s): Fuctional Use of Upper Extremity ?OT Plan ?OT Intensity: Minimum of 1-2 x/day, 45 to 90 minutes ?OT Frequency: 5 out of 7 days ?OT Duration/Estimated Length of Stay: 10-12 days ?OT Treatment/Interventions: Balance/vestibular training;Discharge planning;Functional electrical stimulation;Pain management;Self Care/advanced ADL retraining;Therapeutic Activities;Visual/perceptual remediation/compensation;UE/LE Coordination activities;Therapeutic Exercise;Skin care/wound managment;Patient/family education;Functional mobility training;Disease mangement/prevention;Cognitive remediation/compensation;Community reintegration;DME/adaptive equipment instruction;Neuromuscular re-education;Psychosocial support;Splinting/orthotics;UE/LE Strength taining/ROM;Wheelchair propulsion/positioning ?OT Self Feeding Anticipated Outcome(s): Indep ?OT Basic Self-Care Anticipated  Outcome(s): Mod I ?OT Toileting Anticipated Outcome(s): Mod I ?OT Bathroom Transfers Anticipated Outcome(s): Mod I ?OT Recommendation ?Patient destination: Home ?Follow Up Recommendations: Outpatient OT ?Equipment Recommended: To be determined ? ? ?OT Evaluation ?Precautions/Restrictions  ?Precautions ?Precautions: Cervical ?Precaution Comments: verbally reviewed cervical precautions with pt ?Required Braces or Orthoses: Cervical Brace ?Cervical Brace: Hard collar;At all times ?Restrictions ?Weight Bearing Restrictions: No ?General ?OT Amount of Missed Time: 50 Minutes ?Vital Signs ?Therapy Vitals ?Temp: 98.3 ?F (36.8 ?C) ?Temp Source: Oral ?Pulse Rate: 84 ?Resp: 20 ?BP:  104/86 ?Patient Position (if appropriate): Sitting ?Oxygen Therapy ?SpO2: 97 % ?O2 Device: Room Air ?Pain ?Pain Assessment ?Pain Scale: 0-10 ?Pain Score: 4  ?Home Living/Prior Functioning ?Home Living ?Family/patient expects to be discharged to:: Private residence ?Living Arrangements: Alone ?Available Help at Discharge: Family, Available PRN/intermittently ?Type of Home: Other(Comment) (condo) ?Home Access: Stairs to enter ?Entrance Stairs-Number of Steps: 3 ?Entrance Stairs-Rails: None ?Home Layout: Two level, Bed/bath upstairs ?Alternate Level Stairs-Number of Steps: 12 ?Alternate Level Stairs-Rails: Right ?Bathroom Shower/Tub: Tub/shower unit, Curtain ?Bathroom Toilet: Standard ? Lives With: Alone ?IADL History ?Type of Occupation: works at [employer] ?Prior Function ?Level of Independence: Independent with gait, Independent with homemaking with ambulation, Independent with basic ADLs, Independent with transfers ? Able to Take Stairs?: Yes ?Driving: Yes ?Vocation: Full time employment ?Vision ?Baseline Vision/History: 0 No visual deficits ?Ability to See in Adequate Light: 0 Adequate ?Patient Visual Report: No change from baseline ?Vision Assessment?: No apparent visual deficits ?Perception  ?Perception: Within Functional  Limits ?Praxis ?Praxis: Intact ?Cognition ?Cognition ?Overall Cognitive Status: Within Functional Limits for tasks assessed ?Arousal/Alertness: Awake/alert ?Orientation Level: Person;Place;Situation ?Person: Oriented ?Place: Oriented ?Situation: Oriented ?Memory: Appears intact ?Awareness: Appears intact ?Problem Solving: Appears intact ?Safety/Judgment: Appears intact ?Brief Interview for Mental Status (BIMS) ?Repetition of Three Words (First Attempt): 3 ?Temporal Orientation: Year: Correct ?Temporal Orientation: Month: Accurate within 5 days ?Temporal Orientation: Day: Correct ?Recall: "Sock": Yes, no cue required ?Recall: "Blue": Yes, no cue required ?Recall: "Bed": Yes, no cue required ?BIMS Summary Score: 15 ?Sensation ?Sensation ?Light Touch: Impaired Detail ?Peripheral sensation comments: impaired distal>proximl, inconsistent sensation deficits in thighs and bottoms of feet as well as L hand ulnar side ?Light Touch Impaired Details: Impaired RLE;Impaired LLE;Impaired RUE;Impaired LUE ?Proprioception: Appears Intact ?Coordination ?Gross Motor Movements are Fluid and Coordinated: No ?Fine Motor Movements are Fluid and Coordinated: No ?Coordination and Movement Description: grossly uncoordinated 2/2 BUE/BLE weakness and sensation deficits ?Motor  ?Motor ?Motor: Other (comment) ?Motor - Skilled Clinical Observations: limited coordination 2/2 BUE weakness and sensation deficits  ?Trunk/Postural Assessment  ?Cervical Assessment ?Cervical Assessment: Exceptions to [facility] (NT 2/2 collar) ?Thoracic Assessment ?Thoracic Assessment: Exceptions to [facility] (rounded shoulders) ?Lumbar Assessment ?Lumbar Assessment: Within Functional Limits ?Postural Control ?Postural Control: Deficits on evaluation  ?Balance ?Balance ?Balance Assessed: Yes ?Dynamic Sitting Balance ?Dynamic Sitting - Balance Support: Feet supported ?Dynamic Sitting - Level of Assistance: 5: Stand by assistance ?Dynamic Sitting - Balance Activities: Forward  lean/weight shifting;Reaching for objects ?Static Standing Balance ?Static Standing - Balance Support: During functional activity ?Static Standing - Level of Assistance: 5: Stand by assistance ?Dynamic Standing Balance ?Dynamic Standin

## 2022-03-17 NOTE — Progress Notes (Signed)
PROGRESS NOTE   Subjective/Complaints: Patient seen at bedside today.  Reports that her pain is well-managed.  Has a honeycomb dressing at cervical surgical site, which has some dried blood but is not saturated.  Reports that she still has numbness with being able to feel more so on the right side of her body than her left and she does say that since surgery she has had some mild improvement in her numbness and of her upper thighs on the right side.  ROS: Negative for chest pain SOB nausea vomiting constipation diarrhea positive for joint pain and myalgia positive for sensory changes and weakness.   Objective:   CT THORACIC SPINE WO CONTRAST  Result Date: 03/17/2022 CLINICAL DATA:  41 year old female with fall. Pain. Question of T12 fracture. EXAM: CT THORACIC SPINE WITHOUT CONTRAST TECHNIQUE: Multidetector CT images of the thoracic were obtained using the standard protocol without intravenous contrast. RADIATION DOSE REDUCTION: This exam was performed according to the departmental dose-optimization program which includes automated exposure control, adjustment of the mA and/or kV according to patient size and/or use of iterative reconstruction technique. COMPARISON:  Lumbar MRI 03/10/2022. Intraoperative cervical spine radiographs 03/13/2022. FINDINGS: Limited cervical spine imaging: Cervicothoracic junction alignment is within normal limits. Thoracic spine segmentation: T9 and T12 butterfly vertebrae. Otherwise normal. Alignment: Relatively normal thoracic kyphosis. No spondylolisthesis. Subtle dextroconvex thoracic scoliosis. 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 Background bone mineralization within normal limits. No acute osseous abnormality identified. There is degenerative appearing asymmetric sclerosis of the T12 pedicles and costovertebral junctions greater on the left. Paraspinal and other soft tissues: Partially visible  multi spatial soft tissue inflammation and swelling just above the thoracic inlet (series 4, image 7) eccentric to the right and likely associated with the 3 level cervical ACDF (C4, C5, C6) depicted on the recent intraoperative radiographs. Minimal inflammation tracking along the tracheoesophageal groove into the superior mediastinum. Otherwise thoracic paraspinal soft tissues are within normal limits on this noncontrast exam. No mediastinal hematoma. Small reactive appearing superior mediastinal lymph nodes. Visible major airways are patent. Lower lobe atelectasis left greater than right. No pleural effusion. No pneumothorax is visible. No pericardial effusion. Negative visible noncontrast upper abdominal viscera. Disc levels: Generally mild for age thoracic spine degeneration with the following exceptions: T2-T3: Moderate to severe ligament flavum hypertrophy which is partially calcified. Mild to moderate facet hypertrophy. But no significant spinal stenosis suspected. T10-T11: Mild to moderate facet and ligament flavum hypertrophy greater on the right. No spinal stenosis suspected. T11-T12: Widespread disc bulging and mild endplate spurring. This level visible on the April MRI, with borderline spinal stenosis depicted. T12-L1: Mild disc bulging and endplate spurring. This level visible on the April MRI with no significant spinal stenosis. IMPRESSION: 1. Partially visible postoperative soft tissue inflammation in the lower neck status post recent cervical ACDF. Minimal associated inflammation in the superior mediastinum. 2. No acute osseous abnormality in the thoracic spine. Congenital T9 and T12 butterfly vertebrae. And degenerative appearing sclerosis of the bilateral T12 pedicles and costovertebral junctions. 3. 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 Electronically Signed   By: Odessa Fleming M.D.   On: 03/17/2022 10:17   No results for input(s): WBC, HGB, HCT, PLT in the last 72 hours. No results for input(s): NA, K, CL, CO2, GLUCOSE, BUN, CREATININE, CALCIUM in the last 72 hours.  Intake/Output Summary (Last 24 hours) at 03/17/2022 1704 Last data filed at 03/17/2022 1300 Gross per 24 hour  Intake 320 ml  Output --  Net 320 ml        Physical Exam: Vital Signs Blood pressure 104/86, pulse 84, temperature 98.3 F (36.8 C), temperature source Oral, resp. rate 20, height 5\' 6"  (1.676 m), weight 108 kg, last menstrual period 03/07/2022, SpO2 97 %.    Assessment/Plan: 1. Functional deficits which require 3+ hours per day of interdisciplinary therapy in a comprehensive inpatient rehab setting. Physiatrist is providing close team supervision and 24 hour management of active medical problems listed below. Physiatrist and rehab team continue to assess barriers to discharge/monitor patient progress toward functional and medical goals  Care Tool:  Bathing    Body parts bathed by patient: Right arm, Left arm, Chest, Abdomen, Front perineal area, Buttocks, Right upper leg, Left upper leg, Face   Body parts bathed by helper: Right lower leg, Left lower leg     Bathing assist Assist Level: Moderate Assistance - Patient 50 - 74%     Upper Body Dressing/Undressing Upper body dressing   What is the patient wearing?: Pull over shirt    Upper body assist Assist Level: Minimal Assistance - Patient > 75%    Lower Body Dressing/Undressing Lower body dressing      What is the patient wearing?: Pants     Lower body assist Assist for lower body dressing: Moderate Assistance - Patient 50 - 74%     Toileting Toileting    Toileting assist Assist for toileting: Minimal Assistance - Patient > 75%     Transfers Chair/bed transfer  Transfers assist      Chair/bed transfer assist level: Contact Guard/Touching assist     Locomotion Ambulation   Ambulation assist      Assist level: Contact Guard/Touching assist Assistive device: Walker-rolling Max distance: 428 ft   Walk 10 feet activity   Assist     Assist level: Contact Guard/Touching assist Assistive device: Walker-rolling   Walk 50 feet activity   Assist    Assist level: Contact Guard/Touching assist Assistive device: Walker-rolling    Walk 150 feet activity   Assist    Assist level: Contact Guard/Touching assist Assistive device: Walker-rolling    Walk 10 feet on uneven surface  activity   Assist Walk 10 feet on uneven surfaces activity did not occur: Safety/medical concerns         Wheelchair     Assist Is the patient using a wheelchair?: Yes Type of Wheelchair: Manual    Wheelchair assist level: Supervision/Verbal cueing Max wheelchair distance: 150    Wheelchair 50 feet with 2 turns activity    Assist        Assist Level: Supervision/Verbal cueing   Wheelchair 150 feet activity     Assist      Assist Level: Supervision/Verbal cueing   Blood pressure 104/86, pulse 84, temperature 98.3 F (36.8 C), temperature source Oral, resp. rate 20, height 5\' 6"  (1.676 m), weight 108 kg, last menstrual period 03/07/2022, SpO2 97 %.   Physical exam:  General: Alert  and oriented x 3, No apparent distress.  BMI 38.41 HEENT: Head is normocephalic, atraumatic, PERRLA, EOMI, sclera anicteric, oral mucosa pink and moist, dentition intact, ext ear canals clear,  Neck: Supple without JVD or lymphadenopathy Heart: Reg rate and rhythm. No murmurs rubs or gallops Chest: CTA bilaterally without wheezes, rales, or rhonchi; no distress Abdomen: Soft, non-tender, non-distended, bowel sounds positive. Extremities: No clubbing, cyanosis, or edema. Pulses are 2+ Psych: Pt's affect is appropriate. Pt is cooperative Skin: Cervical incision  clean dry and intact. Neuro: Alert and oriented x4. Cognition intact.  CN II through XII grossly intact. Decreased sensation throughout all extremities.  Does say that she does have numbness but does feel some improvement in her ability to feel her right thigh.  Reports some numbness of her fingers on both right and left side. Musculoskeletal: 4/5 strength throughout.   Medical Problem List and Plan: 1. Functional deficits secondary to cervical myelopathy             -patient may shower but incision must be covered             -ELOS/Goals: modI 10-14 days                    -Admit to CIR 2.  Antithrombotics: -DVT/anticoagulation:  Mechanical: Sequential compression devices, below knee Bilateral lower extremities             -antiplatelet therapy: N/A 3. Incisional pain: continue Oxycodone prn 5/6 continue oxycodone as needed 5 to 10 mg every 4 hours for severe pain also may utilize Robaxin 500 mg tablet every 6 hours for muscle spasms 4. Mood: LCSW to follow for evaluation and support.              -antipsychotic agents: N/A 5. Neuropsych: This patient is capable of making decisions on her own behalf. 6. Skin/Wound Care: Routine pressure relief measures.  7. Fluids/Electrolytes/Nutrition: Monitor I/O. Check CMET in am 8. ADHD/Anxiety: In the past-->no meds for years. 9.  Myalgias: add kpad, continue prn robaxin.  5/6 continue utilize Robaxin 500 mg tablet every 6 hours for muscle spasms 10. Morbid obesity: BMI 38.41: provide dietary education 5/6 continue dietary education as well as physical therapy and progressive exercise 11. Burning thoracic pain; lumbar MRI imaging suggestive of thoracic spine pedicle fractures: CT ordered to further assess. 5/5 MRI findings are described as below: IMPRESSION: 1. T1 hypointensity with associated T2/STIR hyperintensity in the bilateral T12 pedicles, right more than left, raise suspicion for acute to subacute fractures. Correlate with point  tenderness, and consider CT to evaluate for a fracture plane. 2. Prominent central protrusion at L4-L5 which narrows the subarticular zones and may irritate the traversing right L5 nerve root. 3. Overall mild degenerative changes throughout the remainder of the lumbar spine as above without significant spinal canal or neural foraminal stenosis. 5/6 CT today showed results that did not indicate fracture. Impression findings are as described below: 1. Partially visible postoperative soft tissue inflammation in the lower neck status post recent cervical ACDF. Minimal associated inflammation in the superior mediastinum. 2. No acute osseous abnormality in the thoracic spine. Congenital T9 and T12 butterfly vertebrae. And degenerative appearing sclerosis of the bilateral T12 pedicles and costovertebral junctions. 3. 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 -  5/6 May continue to utilize pain strategies as outlined above. 12. Constipation: Senna 2 tabs HS -5/6 continue senna 2 tabs at bedtime continue mobility to promote bowel function.  May utilize as needed's if needed does have available MiraLAX 17 g as needed for mild constipation Dulcolax suppository 10 mg for moderate constipation and a fleets enema for severe constipation. -LBM charted as yesterday 03/16/2022.    LOS: 1 days A FACE TO FACE EVALUATION WAS PERFORMED  Tressia Miners 03/17/2022, 5:04 PM

## 2022-03-18 DIAGNOSIS — G959 Disease of spinal cord, unspecified: Secondary | ICD-10-CM | POA: Diagnosis not present

## 2022-03-18 MED ORDER — SORBITOL 70 % SOLN
60.0000 mL | Freq: Once | Status: AC
  Administered 2022-03-18: 30 mL via ORAL
  Filled 2022-03-18: qty 60

## 2022-03-18 NOTE — Progress Notes (Signed)
PROGRESS NOTE   Subjective/Complaints: Patient seen at bedside today.  Says that she rested well last night with some continued numbness especially of ring ring and pinky of left hand.  Has difficulty with pinpoint sensation throughout.  ROS: Negative for chest pain SOB nausea vomiting constipation diarrhea positive for joint pain and myalgia positive for sensory changes and weakness.   Objective:   CT THORACIC SPINE WO CONTRAST  Result Date: 03/17/2022 CLINICAL DATA:  41 year old female with fall. Pain. Question of T12 fracture. EXAM: CT THORACIC SPINE WITHOUT CONTRAST TECHNIQUE: Multidetector CT images of the thoracic were obtained using the standard protocol without intravenous contrast. RADIATION DOSE REDUCTION: This exam was performed according to the departmental dose-optimization program which includes automated exposure control, adjustment of the mA and/or kV according to patient size and/or use of iterative reconstruction technique. COMPARISON:  Lumbar MRI 03/10/2022. Intraoperative cervical spine radiographs 03/13/2022. FINDINGS: Limited cervical spine imaging: Cervicothoracic junction alignment is within normal limits. Thoracic spine segmentation: T9 and T12 butterfly vertebrae. Otherwise normal. Alignment: Relatively normal thoracic kyphosis. No spondylolisthesis. Subtle dextroconvex thoracic scoliosis. 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 Background bone mineralization within normal limits. No acute osseous abnormality identified. There is degenerative appearing asymmetric sclerosis of the T12 pedicles and costovertebral junctions greater on the left. Paraspinal and other soft tissues: Partially visible multi spatial soft tissue inflammation and swelling just above the thoracic inlet (series 4, image 7) eccentric to the right and likely associated with the 3 level cervical ACDF (C4, C5, C6) depicted on the recent  intraoperative radiographs. Minimal inflammation tracking along the tracheoesophageal groove into the superior mediastinum. Otherwise thoracic paraspinal soft tissues are within normal limits on this noncontrast exam. No mediastinal hematoma. Small reactive appearing superior mediastinal lymph nodes. Visible major airways are patent. Lower lobe atelectasis left greater than right. No pleural effusion. No pneumothorax is visible. No pericardial effusion. Negative visible noncontrast upper abdominal viscera. Disc levels: Generally mild for age thoracic spine degeneration with the following exceptions: T2-T3: Moderate to severe ligament flavum hypertrophy which is partially calcified. Mild to moderate facet hypertrophy. But no significant spinal stenosis suspected. T10-T11: Mild to moderate facet and ligament flavum hypertrophy greater on the right. No spinal stenosis suspected. T11-T12: Widespread disc bulging and mild endplate spurring. This level visible on the April MRI, with borderline spinal stenosis depicted. T12-L1: Mild disc bulging and endplate spurring. This level visible on the April MRI with no significant spinal stenosis. IMPRESSION: 1. Partially visible postoperative soft tissue inflammation in the lower neck status post recent cervical ACDF. Minimal associated inflammation in the superior mediastinum. 2. No acute osseous abnormality in the thoracic spine. Congenital T9 and T12 butterfly vertebrae. And degenerative appearing sclerosis of the bilateral T12 pedicles and costovertebral junctions. 3. 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 Electronically Signed   By: Odessa Fleming M.D.   On: 03/17/2022 10:17   No results for input(s): WBC, HGB, HCT, PLT in the last 72  hours.  No results for input(s): NA, K, CL, CO2, GLUCOSE, BUN, CREATININE, CALCIUM in the last 72 hours.  Intake/Output Summary (Last 24 hours) at 03/18/2022 1344 Last data filed at 03/18/2022 0730 Gross per 24 hour  Intake 476 ml  Output --  Net 476 ml        Physical Exam: Vital Signs Blood pressure (!) 109/59, pulse 80, temperature 97.9 F (36.6 C), temperature source Oral, resp. rate 18, height  (1.676 m), weight 108 kg, last menstrual period 03/07/2022, SpO2 99 %.   Physical exam:  General: Alert and oriented x 3, No apparent distress.  BMI 38.41 HEENT: Head is normocephalic, atraumatic, PERRLA, EOMI, sclera anicteric, oral mucosa pink and moist, dentition intact, ext ear canals clear,  Neck: Supple without JVD or lymphadenopathy Heart: Reg rate and rhythm. No murmurs rubs or gallops Chest: CTA bilaterally without wheezes, rales, or rhonchi; no distress Abdomen: Soft, non-tender, non-distended, bowel sounds positive. Extremities: No clubbing, cyanosis, or edema. Pulses are 2+ Psych: Pt's affect is appropriate. Pt is cooperative Skin: Cervical incision clean dry and intact. Neuro: Alert and oriented x4. Cognition intact.  CN II through XII grossly intact. Decreased sensation throughout all extremities.  Does say that she does have numbness but does feel some improvement in her ability to feel her right thigh.  Reports some numbness of her fingers on both right and left side, with left side more numb especially left ring finger and pinky. Musculoskeletal: 4/5 strength throughout.   Assessment/Plan: 1. Functional deficits which require 3+ hours per day of interdisciplinary therapy in a comprehensive inpatient rehab setting. Physiatrist is providing close team supervision and 24 hour management of active medical problems listed below. Physiatrist and rehab team continue to assess barriers to discharge/monitor patient progress toward functional and medical goals  Care  Tool:  Bathing    Body parts bathed by patient: Right arm, Left arm, Chest, Abdomen, Front perineal area, Buttocks, Right upper leg, Left upper leg, Face   Body parts bathed by helper: Right lower leg, Left lower leg     Bathing assist Assist Level: Moderate Assistance - Patient 50 - 74%     Upper Body Dressing/Undressing Upper body dressing   What is the patient wearing?: Pull over shirt    Upper body assist Assist Level: Moderate Assistance - Patient 50 - 74%    Lower Body Dressing/Undressing Lower body dressing      What is the patient wearing?: Underwear/pull up     Lower body assist Assist for lower body dressing: Moderate Assistance - Patient 50 - 74%     Toileting Toileting    Toileting assist Assist for toileting: Moderate Assistance - Patient 50 - 74%     Transfers Chair/bed transfer  Transfers assist     Chair/bed transfer assist level: Contact Guard/Touching assist     Locomotion Ambulation   Ambulation assist      Assist level: Contact Guard/Touching assist Assistive device: Walker-rolling Max distance: 428 ft   Walk 10 feet activity   Assist     Assist level: Contact Guard/Touching assist Assistive device: Walker-rolling   Walk 50 feet activity   Assist    Assist level: Contact Guard/Touching assist Assistive device: Walker-rolling    Walk 150 feet activity   Assist    Assist level: Contact Guard/Touching assist Assistive device: Walker-rolling    Walk 10 feet on uneven surface  activity   Assist Walk 10 feet on uneven surfaces activity did not occur: Safety/medical concerns  Wheelchair     Assist Is the patient using a wheelchair?: Yes Type of Wheelchair: Manual    Wheelchair assist level: Supervision/Verbal cueing Max wheelchair distance: 150    Wheelchair 50 feet with 2 turns activity    Assist        Assist Level: Supervision/Verbal cueing   Wheelchair 150 feet activity      Assist      Assist Level: Supervision/Verbal cueing   Blood pressure (!) 109/59, pulse 80, temperature 97.9 F (36.6 C), temperature source Oral, resp. rate 18, height  (1.676 m), weight 108 kg, last menstrual period 03/07/2022, SpO2 99 %.    Medical Problem List and Plan: 1. Functional deficits secondary to cervical myelopathy             -patient may shower but incision must be covered             -ELOS/Goals: modI 10-14 days                    -Admit to CIR 2.  Antithrombotics: -DVT/anticoagulation:  Mechanical: Sequential compression devices, below knee Bilateral lower extremities             -antiplatelet therapy: N/A 3. Incisional pain: continue Oxycodone prn 5/7 continue oxycodone as needed 5 to 10 mg every 4 hours for severe pain also may utilize Robaxin 500 mg tablet every 6 hours for muscle spasms 4. Mood: LCSW to follow for evaluation and support.              -antipsychotic agents: N/A 5. Neuropsych: This patient is capable of making decisions on her own behalf. 6. Skin/Wound Care: Routine pressure relief measures.  7. Fluids/Electrolytes/Nutrition: Monitor I/O. Check CMET in am 8. ADHD/Anxiety: In the past-->no meds for years. 9.  Myalgias: add kpad, continue prn robaxin.  5/7 continue utilize Robaxin 500 mg tablet every 6 hours for muscle spasms 10. Morbid obesity: BMI 38.41: provide dietary education 5/7 continue dietary education as well as physical therapy and progressive exercise 11. Burning thoracic pain; lumbar MRI imaging suggestive of thoracic spine pedicle fractures: CT ordered to further assess. 5/5 MRI findings are described as below: IMPRESSION: 1. T1 hypointensity with associated T2/STIR hyperintensity in the bilateral T12 pedicles, right more than left, raise suspicion for acute to subacute fractures. Correlate with point tenderness, and consider CT to evaluate for a fracture plane. 2. Prominent central protrusion at L4-L5 which narrows  the subarticular zones and may irritate the traversing right L5 nerve root. 3. Overall mild degenerative changes throughout the remainder of the lumbar spine as above without significant spinal canal or neural foraminal stenosis. 5/6 CT today showed results that did not indicate fracture. Impression findings are as described below: 1. Partially visible postoperative soft tissue inflammation in the lower neck status post recent cervical ACDF. Minimal associated inflammation in the superior mediastinum. 2. No acute osseous abnormality in the thoracic spine. Congenital T9 and T12 butterfly vertebrae. And degenerative appearing sclerosis of the bilateral T12 pedicles and costovertebral junctions. 3. 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 -5/6 May continue to utilize pain strategies as outlined above. 12. Constipation: Senna 2 tabs HS -5/6 continue senna 2 tabs at bedtime continue mobility to promote bowel function.  May utilize as needed's if needed  does have available MiraLAX 17 g as needed for mild constipation Dulcolax suppository 10 mg for moderate constipation and a fleets enema for severe constipation. -LBM charted as yesterday 03/16/2022. 5/7 LBM today, pt reports she feels relieved.    LOS: 2 days A FACE TO FACE EVALUATION WAS PERFORMED  Tressia Miners 03/18/2022, 1:44 PM

## 2022-03-19 DIAGNOSIS — G8250 Quadriplegia, unspecified: Secondary | ICD-10-CM | POA: Diagnosis present

## 2022-03-19 LAB — CBC WITH DIFFERENTIAL/PLATELET
Abs Immature Granulocytes: 0.02 10*3/uL (ref 0.00–0.07)
Basophils Absolute: 0 10*3/uL (ref 0.0–0.1)
Basophils Relative: 1 %
Eosinophils Absolute: 0.3 10*3/uL (ref 0.0–0.5)
Eosinophils Relative: 4 %
HCT: 44.9 % (ref 36.0–46.0)
Hemoglobin: 15 g/dL (ref 12.0–15.0)
Immature Granulocytes: 0 %
Lymphocytes Relative: 27 %
Lymphs Abs: 2 10*3/uL (ref 0.7–4.0)
MCH: 29.2 pg (ref 26.0–34.0)
MCHC: 33.4 g/dL (ref 30.0–36.0)
MCV: 87.4 fL (ref 80.0–100.0)
Monocytes Absolute: 0.6 10*3/uL (ref 0.1–1.0)
Monocytes Relative: 8 %
Neutro Abs: 4.6 10*3/uL (ref 1.7–7.7)
Neutrophils Relative %: 60 %
Platelets: 290 10*3/uL (ref 150–400)
RBC: 5.14 MIL/uL — ABNORMAL HIGH (ref 3.87–5.11)
RDW: 13.8 % (ref 11.5–15.5)
WBC: 7.4 10*3/uL (ref 4.0–10.5)
nRBC: 0 % (ref 0.0–0.2)

## 2022-03-19 LAB — COMPREHENSIVE METABOLIC PANEL
ALT: 37 U/L (ref 0–44)
AST: 30 U/L (ref 15–41)
Albumin: 3.5 g/dL (ref 3.5–5.0)
Alkaline Phosphatase: 74 U/L (ref 38–126)
Anion gap: 6 (ref 5–15)
BUN: 9 mg/dL (ref 6–20)
CO2: 26 mmol/L (ref 22–32)
Calcium: 9.2 mg/dL (ref 8.9–10.3)
Chloride: 104 mmol/L (ref 98–111)
Creatinine, Ser: 0.55 mg/dL (ref 0.44–1.00)
GFR, Estimated: >60 mL/min (ref >60)
Glucose, Bld: 109 mg/dL — ABNORMAL HIGH (ref 70–99)
Potassium: 4.5 mmol/L (ref 3.5–5.1)
Sodium: 136 mmol/L (ref 135–145)
Total Bilirubin: 0.6 mg/dL (ref 0.3–1.2)
Total Protein: 6.9 g/dL (ref 6.5–8.1)

## 2022-03-19 NOTE — Progress Notes (Signed)
Physical Therapy Session Note ? ?Patient Details  ?Name: Alexandra Schmitt ?MRN: [id number] ?Date of Birth: Feb 08, 1981 ? ?Today's Date: 03/19/2022 ?PT Individual Time: 3545-6256 ?PT Individual Time Calculation (min): 53 min  ? ?Short Term Goals: ?Week 1:  PT Short Term Goal 1 (Week 1): pt will perform stairs x 12 with 1 hand rail and CGA ?PT Short Term Goal 2 (Week 1): Pt will ambulate with supervision >150 ft ?PT Short Term Goal 3 (Week 1): Pt will improve TUG score by MCID of 5.7 sec ? ?Skilled Therapeutic Interventions/Progress Updates:  ?  In bed and agreeable to session. Bed mobility supine<>sit supervision with logroll from bed and mat. Sit<>stand supervision using rw. Gait: 400 ftX1, 300 ft x2 with rw, supervision. TE: hooklying - bent knee rotation in controlled range, single bent knee add/abd, Rt. Manual DF stretch, SKC with assist in painfree range, pelvic tilt X10. Seated Row grade I band X12, seated shld ext with scap retraction and core activation grade I band 1x12. Following return to bed, pt reports less pain and appreciative of session. All needs in reach and bed alarm activated.  ? ?Therapy Documentation ?Precautions:  ?Precautions ?Precautions: Cervical ?Precaution Booklet Issued: No ?Precaution Comments: verbally reviewed cervical precautions with pt ?Required Braces or Orthoses: Cervical Brace ?Cervical Brace: Hard collar, At all times ?Restrictions ?Weight Bearing Restrictions: No ?General: ?  ?Pain: Reports mild to moderate pain in low back. Improved with activity, no intervention required.  ?  ? ?Therapy/Group: Individual Therapy ? ?Delton See ?03/19/2022, 4:53 PM  ?

## 2022-03-19 NOTE — Discharge Instructions (Addendum)
Inpatient Rehab Discharge Instructions ? ?Darya Bigler Monsour ?Discharge date and time:  03/28/22 ? ?Activities/Precautions/ Functional Status: ?Activity: no lifting, driving, or strenuous exercise till cleared by MD  ?Diet: regular diet ?Wound Care: keep wound clean and dry ? ? ?Functional status:  ?___ No restrictions     ___ Walk up steps independently ?___ 24/7 supervision/assistance   ___ Walk up steps with assistance ?_X__ Intermittent supervision/assistance  __X_ Bathe/dress independently ?___ Walk with walker     ___ Bathe/dress with assistance ?___ Walk Independently    ___ Shower independently ?___ Walk with assistance    ___ Shower with assistance ?_X__ No alcohol     ___ Return to work/school ________ ? ? ? ?COMMUNITY REFERRALS UPON DISCHARGE:   ? ?Outpatient: PT     OT     ?            Agency: [facility]: [phone] ?            Appointment Date/Time: *Please expect follow-up within 7-10 business days to schedule your appointment. If you have not received follow-up, be sure to contact the site directly.* ? ? ?Medical Equipment/Items Ordered: rolling walker ?                                                Agency/Supplier: [employer] [phone] ? ? ?Special Instructions: ? ? ? ?My questions have been answered and I understand these instructions. I will adhere to these goals and the provided educational materials after my discharge from the hospital. ? ?Patient/Caregiver Signature _______________________________ Date __________ ? ?Clinician Signature _______________________________________ Date __________ ? ?Please bring this form and your medication list with you to all your follow-up doctor's appointments.   ?

## 2022-03-19 NOTE — Progress Notes (Signed)
Inpatient Rehabilitation  Patient information reviewed and entered into eRehab system by Karissa Meenan M. Sherwood Castilla, M.A., CCC/SLP, PPS Coordinator.  Information including medical coding, functional ability and quality indicators will be reviewed and updated through discharge.    

## 2022-03-19 NOTE — IPOC Note (Signed)
Overall Plan of Care (IPOC) ?Patient Details ?Name: Alexandra Schmitt ?MRN: [id number] ?DOB: May 23, 1981 ? ?Admitting Diagnosis: Cervical myelopathy (HCC) ? ?Hospital Problems: Principal Problem: ?  Cervical myelopathy (HCC) ? ? ? ? Functional Problem List: ?Nursing Bowel, Edema, Endurance, Medication Management, Motor, Pain, Safety, Skin Integrity  ?PT Balance, Pain, Safety, Endurance, Motor, Edema  ?OT Balance, Endurance, Motor, Pain, Perception, Safety, Sensory  ?SLP    ?TR    ?    ? Basic ADL?s: ?OT Grooming, Eating, Bathing, Dressing, Toileting  ? ?  Advanced  ADL?s: ?OT Simple Meal Preparation, Light Housekeeping  ?   ?Transfers: ?PT Bed Mobility, Bed to Chair, Car, Furniture  ?OT Toilet, Tub/Shower  ? ?  Locomotion: ?PT Ambulation, Stairs  ? ?  Additional Impairments: ?OT Fuctional Use of Upper Extremity  ?SLP   ?  ?   ?TR    ? ? ?Anticipated Outcomes ?Item Anticipated Outcome  ?Self Feeding Indep  ?Swallowing ?   ?  ?Basic self-care ? Mod I  ?Toileting ? Mod I ?  ?Bathroom Transfers Mod I  ?Bowel/Bladder ? supervision  ?Transfers ? mod I transfers  ?Locomotion ? mod I gait  ?Communication ?    ?Cognition ?    ?Pain ? < 3  ?Safety/Judgment ? supervision  ? ?Therapy Plan: ?PT Intensity: Minimum of 1-2 x/day ,45 to 90 minutes ?PT Frequency: 5 out of 7 days ?PT Duration Estimated Length of Stay: 10-14 days ?OT Intensity: Minimum of 1-2 x/day, 45 to 90 minutes ?OT Frequency: 5 out of 7 days ?OT Duration/Estimated Length of Stay: 10-12 days ?   ? ?Due to the current state of emergency, patients may not be receiving their 3-hours of Medicare-mandated therapy. ? ? Team Interventions: ?Nursing Interventions Patient/Family Education, Bowel Management, Disease Management/Prevention, Pain Management, Medication Management, Skin Care/Wound Management, Discharge Planning  ?PT interventions Ambulation/gait training, Discharge planning, DME/adaptive equipment instruction, Functional mobility training, Pain management,  Psychosocial support, Splinting/orthotics, Therapeutic Activities, UE/LE Strength taining/ROM, Visual/perceptual remediation/compensation, Warden/ranger, Community reintegration, Disease management/prevention, Neuromuscular re-education, Functional electrical stimulation, Patient/family education, Skin care/wound management, Stair training, Therapeutic Exercise, UE/LE Coordination activities, Wheelchair propulsion/positioning  ?OT Interventions Balance/vestibular training, Discharge planning, Functional electrical stimulation, Pain management, Self Care/advanced ADL retraining, Therapeutic Activities, Visual/perceptual remediation/compensation, UE/LE Coordination activities, Therapeutic Exercise, Skin care/wound managment, Patient/family education, Functional mobility training, Disease mangement/prevention, Cognitive remediation/compensation, Community reintegration, DME/adaptive equipment instruction, Neuromuscular re-education, Psychosocial support, Splinting/orthotics, UE/LE Strength taining/ROM, Wheelchair propulsion/positioning  ?SLP Interventions    ?TR Interventions    ?SW/CM Interventions Discharge Planning, Psychosocial Support, Patient/Family Education  ? ?Barriers to Discharge ?MD  Medical stability, Home enviroment access/loayout, Wound care, Lack of/limited family support, Weight, and Weight bearing restrictions  ?Nursing Decreased caregiver support, Home environment access/layout, Wound Care, Lack of/limited family support, Weight ?2 level, 3 steps, no rails. Bed/bath upstairs. Lives alone. Family available PRN/intermittently.  ?PT Inaccessible home environment, Decreased caregiver support, Neurogenic Bowel & Bladder, Wound Care, Lack of/limited family support ?   ?OT Home environment access/layout ?   ?SLP   ?   ?SW   ?   ? ?Team Discharge Planning: ?Destination: PT-Home ,OT- Home , SLP-  ?Projected Follow-up: PT-Outpatient PT, OT-  Outpatient OT, SLP-  ?Projected Equipment Needs:  PT-Rolling walker with 5" wheels, OT- To be determined, SLP-  ?Equipment Details: PT- , OT-  ?Patient/family involved in discharge planning: PT- Patient,  OT-Patient, SLP-  ? ?MD ELOS: 10-14 days ?Medical Rehab Prognosis:  Good ?Assessment:  ?The patient has been admitted for CIR  therapies with the diagnosis of cervical myelopathy . The team will be addressing functional mobility, strength, stamina, balance, safety, adaptive techniques and equipment, self-care, bowel and bladder mgt, patient and caregiver education. Goals have been set at mod I to supervision. Anticipated discharge destination is home. ? ? ? ?  ? ? ?See Team Conference Notes for weekly updates to the plan of care ? ?

## 2022-03-19 NOTE — Care Management (Signed)
Inpatient Rehabilitation Center ?Individual Statement of Services ? ?Patient Name:  Alexandra Schmitt  ?Date:  03/19/2022 ? ?Welcome to the Inpatient Rehabilitation Center.  Our goal is to provide you with an individualized program based on your diagnosis and situation, designed to meet your specific needs.  With this comprehensive rehabilitation program, you will be expected to participate in at least 3 hours of rehabilitation therapies Monday-Friday, with modified therapy programming on the weekends. ? ?Your rehabilitation program will include the following services:  Physical Therapy (PT), Occupational Therapy (OT), 24 hour per day rehabilitation nursing, Therapeutic Recreaction (TR), Psychology, Neuropsychology, Care Coordinator, Rehabilitation Medicine, Nutrition Services, Pharmacy Services, and Other ? ?Weekly team conferences will be held on Tuesdays to discuss your progress.  Your Inpatient Rehabilitation Care Coordinator will talk with you frequently to get your input and to update you on team discussions.  Team conferences with you and your family in attendance may also be held. ? ?Expected length of stay: 10-14 days ? ?Overall anticipated outcome: Independent with Assistive Device ? ?Depending on your progress and recovery, your program may change. Your Inpatient Rehabilitation Care Coordinator will coordinate services and will keep you informed of any changes. Your Inpatient Rehabilitation Care Coordinator's name and contact numbers are listed  below. ? ?The following services may also be recommended but are not provided by the Inpatient Rehabilitation Center:  ?Driving Evaluations ?Home Health Rehabiltiation Services ?Outpatient Rehabilitation Services ?Vocational Rehabilitation ?  ?Arrangements will be made to provide these services after discharge if needed.  Arrangements include referral to agencies that provide these services. ? ?Your insurance has been verified to be:  [place] ? ?Your primary doctor is:   No PCP listed ? ?Pertinent information will be shared with your doctor and your insurance company. ? ?Inpatient Rehabilitation Care Coordinator:  Susie Cassette [phone] or (C) [phone] ? ?Information discussed with and copy given to patient by: [name], 03/19/2022, 9:50 AM    ?

## 2022-03-19 NOTE — Progress Notes (Addendum)
?                                                       PROGRESS NOTE ? ? ?Subjective/Complaints:  ?Pt reports had LBM yesterday- voiding well.  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  ?Does have some swelling.  ?Neck still TTP however "not out of ordinary".  ? ?ROS:  ?Pt denies SOB, abd pain, CP, N/V/C/D, and vision changes ? ? ?Objective: ?  ?CT THORACIC SPINE WO CONTRAST ? ?Result Date: 03/17/2022 ?CLINICAL DATA:  41 year old female with fall. Pain. Question of T12 fracture. EXAM: CT THORACIC SPINE WITHOUT CONTRAST TECHNIQUE: Multidetector CT images of the thoracic were obtained using the standard protocol without intravenous contrast. RADIATION DOSE REDUCTION: This exam was performed according to the departmental dose-optimization program which includes automated exposure control, adjustment of the mA and/or kV according to patient size and/or use of iterative reconstruction technique. COMPARISON:  Lumbar MRI 03/10/2022. Intraoperative cervical spine radiographs 03/13/2022. FINDINGS: Limited cervical spine imaging: Cervicothoracic junction alignment is within normal limits. Thoracic spine segmentation: T9 and T12 butterfly vertebrae. Otherwise normal. Alignment: Relatively normal thoracic kyphosis. No spondylolisthesis. Subtle dextroconvex thoracic scoliosis. 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 Background bone mineralization within normal limits. No acute osseous abnormality identified. There is degenerative appearing asymmetric sclerosis of the T12 pedicles and costovertebral junctions greater on the left. Paraspinal and other soft tissues: Partially visible multi spatial soft tissue inflammation and swelling just above the thoracic inlet (series 4, image 7) eccentric to the right and likely associated with the 3 level cervical ACDF (C4, C5, C6) depicted on the recent intraoperative radiographs.  Minimal inflammation tracking along the tracheoesophageal groove into the superior mediastinum. Otherwise thoracic paraspinal soft tissues are within normal limits on this noncontrast exam. No mediastinal hematoma. Small reactive appearing superior mediastinal lymph nodes. Visible major airways are patent. Lower lobe atelectasis left greater than right. No pleural effusion. No pneumothorax is visible. No pericardial effusion. Negative visible noncontrast upper abdominal viscera. Disc levels: Generally mild for age thoracic spine degeneration with the following exceptions: T2-T3: Moderate to severe ligament flavum hypertrophy which is partially calcified. Mild to moderate facet hypertrophy. But no significant spinal stenosis suspected. T10-T11: Mild to moderate facet and ligament flavum hypertrophy greater on the right. No spinal stenosis suspected. T11-T12: Widespread disc bulging and mild endplate spurring. This level visible on the April MRI, with borderline spinal stenosis depicted. T12-L1: Mild disc bulging and endplate spurring. This level visible on the April MRI with no significant spinal stenosis. IMPRESSION: 1. Partially visible postoperative soft tissue inflammation in the lower neck status post recent cervical ACDF. Minimal associated inflammation in the superior mediastinum. 2. No acute osseous abnormality in the thoracic spine. Congenital T9 and T12 butterfly vertebrae. And degenerative appearing sclerosis of the bilateral T12 pedicles and costovertebral junctions. 3. 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 Electronically Signed   By: Odessa Fleming M.D.   On: 03/17/2022 10:17   ?Recent Labs  ?  03/19/22 ?1829  ?WBC 7.4  ?  HGB 15.0  ?HCT 44.9  ?PLT 290  ? ?Recent Labs  ?   03/19/22 ?7829  ?NA 136  ?K 4.5  ?CL 104  ?CO2 26  ?GLUCOSE 109*  ?BUN 9  ?CREATININE 0.55  ?CALCIUM 9.2  ? ? ?Intake/Output Summary (Last 24 hours) at 03/19/2022 5621 ?Last data filed at 03/19/2022 3086 ?Gross per 24 hour  ?Intake 118 ml  ?Output --  ?Net 118 ml  ?  ? ?  ? ?Physical Exam: ?Vital Signs ?Blood pressure (!) 120/56, pulse 79, temperature 98.1 ?F (36.7 ?C), temperature source Oral, resp. rate 18, height 5\' 6"  (1.676 m), weight 108 kg, last menstrual period 03/07/2022, SpO2 97 %. ? ? ?Physical exam: ? ? ?General: awake, alert, appropriate, sitting on toilet- was able to stand with RW on own/Supervision- OTA and myself in room; NAD ?HENT: conjugate gaze; oropharynx moist ?CV: regular rate; no JVD ?Pulmonary: CTA B/L; no W/R/R- good air movement ?GI: soft, NT, ND, (+)BS- BMI 38.41 ?Psychiatric: appropriate ?Neurological: Ox3 ?Skin: Cervical incision clean dry and intact. Honeycomb dressing in place- mild associated swelling, but no erythema seen.,  ?Neuro: Alert and oriented x4. Cognition intact.  CN II through XII grossly intact. Decreased sensation throughout all extremities.  Does say that she does have numbness but does feel some improvement in her ability to feel her right thigh.  Reports some numbness of her fingers on both right and left side, with left side more numb especially left ring finger and pinky. ?Musculoskeletal: 4/5 strength throughout.  ? ?Assessment/Plan: ?1. Functional deficits which require 3+ hours per day of interdisciplinary therapy in a comprehensive inpatient rehab setting. ?Physiatrist is providing close team supervision and 24 hour management of active medical problems listed below. ?Physiatrist and rehab team continue to assess barriers to discharge/monitor patient progress toward functional and medical goals ? ?Care Tool: ? ?Bathing ?   ?Body parts bathed by patient: Right arm, Left arm, Chest, Abdomen, Front perineal area, Buttocks, Right upper leg, Left upper leg, Face  ? Body  parts bathed by helper: Right lower leg, Left lower leg ?  ?  ?Bathing assist Assist Level: Moderate Assistance - Patient 50 - 74% ?  ?  ?Upper Body Dressing/Undressing ?Upper body dressing   ?What is the patient wearing?: Pull over shirt ?   ?Upper body assist Assist Level: Supervision/Verbal cueing ?   ?Lower Body Dressing/Undressing ?Lower body dressing ? ? ?   ?What is the patient wearing?: Pants ? ?  ? ?Lower body assist Assist for lower body dressing: Supervision/Verbal cueing ?   ? ?Toileting ?Toileting    ?Toileting assist Assist for toileting: Supervision/Verbal cueing ?  ?  ?Transfers ?Chair/bed transfer ? ?Transfers assist ?   ? ?Chair/bed transfer assist level: Contact Guard/Touching assist ?  ?  ?Locomotion ?Ambulation ? ? ?Ambulation assist ? ?   ? ?Assist level: Contact Guard/Touching assist ?Assistive device: Walker-rolling ?Max distance: 428 ft  ? ?Walk 10 feet activity ? ? ?Assist ?   ? ?Assist level: Contact Guard/Touching assist ?Assistive device: Walker-rolling  ? ?Walk 50 feet activity ? ? ?Assist   ? ?Assist level: Contact Guard/Touching assist ?Assistive device: Walker-rolling  ? ? ?Walk 150 feet activity ? ? ?Assist   ? ?Assist level: Contact Guard/Touching assist ?Assistive device: Walker-rolling ?  ? ?Walk 10 feet on uneven surface  ?activity ? ? ?Assist Walk 10 feet on uneven surfaces activity did not occur: Safety/medical concerns ? ? ?  ?   ? ?Wheelchair ? ? ? ? ?  Assist Is the patient using a wheelchair?: Yes ?Type of Wheelchair: Manual ?  ? ?Wheelchair assist level: Supervision/Verbal cueing ?Max wheelchair distance: 150  ? ? ?Wheelchair 50 feet with 2 turns activity ? ? ? ?Assist ? ?  ?  ? ? ?Assist Level: Supervision/Verbal cueing  ? ?Wheelchair 150 feet activity  ? ? ? ?Assist ?   ? ? ?Assist Level: Supervision/Verbal cueing  ? ?Blood pressure (!) 120/56, pulse 79, temperature 98.1 ?F (36.7 ?C), temperature source Oral, resp. rate 18, height 5\' 6"  (1.676 m), weight 108 kg, last  menstrual period 03/07/2022, SpO2 97 %. ? ? ? ?Medical Problem List and Plan: ?1. 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            -pat

## 2022-03-19 NOTE — Progress Notes (Signed)
Occupational Therapy Session Note ? ?Patient Details  ?Name: Alexandra Schmitt ?MRN: [id number] ?Date of Birth: Mar 21, 1981 ? ?Today's Date: 03/19/2022 ?OT Individual Time: 8938-1017 ?OT Individual Time Calculation (min): 57 min  ? ? ?Short Term Goals: ?Week 1:  OT Short Term Goal 1 (Week 1): Pt will improve R hand grip strength to 40# and L hand to 20# to improve functional use ?OT Short Term Goal 2 (Week 1): Pt will perform 3/3 toileting tasks with CGA ?OT Short Term Goal 3 (Week 1): Pt will perform LB ADL with CGA using AE PRN ?OT Short Term Goal 4 (Week 1): Pt will perform UB dress over collar with Set up ? ?Skilled Therapeutic Interventions/Progress Updates:  ?  Pt resting in bed upon arrival and agreeable to therapy. Pt requested to use toilet. Pt selected clothing prior to getting OOB. Bed mobility and amb with RW to bathroom with CGA. Pt completed toileting and dressing with sit<>stand from toilet with supervision. Discussed home setup. Pt has been living in living room downstairs following earlier fall but bedroom is upstairs. Pt transitioned to ADL tub room and practiced TTB transfers with supervision. Pt has access to TTB to use after discharge. Pt returned to room and amb with RW to recliner. Pt remained in recliner with all needs within reach and belt alarm activated.  ? ?Therapy Documentation ?Precautions:  ?Precautions ?Precautions: Cervical ?Precaution Booklet Issued: No ?Precaution Comments: verbally reviewed cervical precautions with pt ?Required Braces or Orthoses: Cervical Brace ?Cervical Brace: Hard collar, At all times ?Restrictions ?Weight Bearing Restrictions: No ? ? ?Pain: ? Pt c/o discomfort across Bil shoulders and back of head from C-collar; repositioned ? ? ?Therapy/Group: Individual Therapy ? ?Rich Brave ?03/19/2022, 8:01 AM ?

## 2022-03-20 MED ORDER — ENOXAPARIN SODIUM 40 MG/0.4ML IJ SOSY
40.0000 mg | PREFILLED_SYRINGE | INTRAMUSCULAR | Status: DC
  Administered 2022-03-20 – 2022-03-25 (×6): 40 mg via SUBCUTANEOUS
  Filled 2022-03-20 (×6): qty 0.4

## 2022-03-20 NOTE — Progress Notes (Signed)
Occupational Therapy Session Note ? ?Patient Details  ?Name: Alexandra Schmitt ?MRN: [id number] ?Date of Birth: 03/31/1981 ? ?Today's Date: 03/20/2022 ?OT Individual Time: 0347-4259 ?OT Individual Time Calculation (min): 72 min  ? ? ?Short Term Goals: ?Week 1:  OT Short Term Goal 1 (Week 1): Pt will improve R hand grip strength to 40# and L hand to 20# to improve functional use ?OT Short Term Goal 2 (Week 1): Pt will perform 3/3 toileting tasks with CGA ?OT Short Term Goal 3 (Week 1): Pt will perform LB ADL with CGA using AE PRN ?OT Short Term Goal 4 (Week 1): Pt will perform UB dress over collar with Set up ? ?Skilled Therapeutic Interventions/Progress Updates:  ?  Pt resting in bed upon arrival. Pt excited about taking a shower this morning. OT intervention with focus on bed mobility, sit<>stand, standing balance, functional amb with RW, bathing at shower level, dressing with sit<stand from EOB, education regarding cervical collar pads, and discharge planning. CGA for amb with RW in/out of bathroom. Bathing/dressing with supervision. Pt surprised how exhausted she was after shower and dressing. Discussed energy conservations strategies. Educated pt on changing pads of cervical collar. Pt reports she has a neighbor who can assist with this task. Pt remained in recliner with belt alarm activated. All needs within reach.  ? ?Therapy Documentation ?Precautions:  ?Precautions ?Precautions: Cervical ?Precaution Booklet Issued: No ?Precaution Comments: verbally reviewed cervical precautions with pt ?Required Braces or Orthoses: Cervical Brace ?Cervical Brace: Hard collar, At all times ?Restrictions ?Weight Bearing Restrictions: No ? ?Pain: ?Pt reports neck discomfort; repositioned ? ? ?Therapy/Group: Individual Therapy ? ?Rich Brave ?03/20/2022, 8:18 AM ?

## 2022-03-20 NOTE — Progress Notes (Signed)
Patient ID: Alexandra Schmitt, female   DOB: 11-29-1980, 41 y.o.   MRN: [id number] ? ?SW met with pt in room to provide updates from team conference, and d/c date 5/17. SW informed will provide updates once there is more information. ? ?Loralee Pacas, MSW, LCSWA ?Office: [phone] ?Cell: [phone] ?Fax: [phone]  ?

## 2022-03-20 NOTE — Progress Notes (Signed)
Inpatient Rehabilitation Care Coordinator ?Assessment and Plan ?Patient Details  ?Name: Alexandra Schmitt ?MRN: [id number] ?Date of Birth: Oct 15, 1981 ? ?Today's Date: 03/20/2022 ? ?Hospital Problems: Principal Problem: ?  Acute incomplete quadriplegia ([place]) ?Active Problems: ?  Cervical myelopathy ([facility]) ? ?Past Medical History:  ?Past Medical History:  ?Diagnosis Date  ? ADHD (attention deficit hyperactivity disorder)   ? Anxiety   ? ?Past Surgical History:  ?Past Surgical History:  ?Procedure Laterality Date  ? ANTERIOR CERVICAL DECOMP/DISCECTOMY FUSION N/A 03/13/2022  ? Procedure: Cervical four-five, Cervical five-six Anterior Cervical Decompression Discectomy Fusion;  Surgeon: Dawley, Theodoro Doing, DO;  Location: [place];  Service: Neurosurgery;  Laterality: N/A;  ? BROKEN NOSE    ? RIGHT FOOT RECONSTRUCTION    ? TONSILLECTOMY    ? ?Social History:  reports that she has quit smoking. She quit smokeless tobacco use about 7 years ago. She reports current alcohol use. She reports that she does not use drugs. ? ?Family / Support Systems ?Marital Status: Single ?Spouse/Significant Other: N/A ?Children: no children ?Other Supports: Neighbor/friend who lives in building across from her apartment complex ?Anticipated Caregiver: Friend ?Ability/Limitations of Caregiver: Intermittent support from friend/father ?Caregiver Availability: Intermittent ?Family Dynamics: Pt lives alone with her dog. ? 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 States it ended on 5/2. She  has been encouraged to reach out to HR to get an extension so documents can be completed. ?Legal History/Current Legal Issues: Denies ?Guardian/Conservator: N/A  ? ?Abuse/Neglect ?Abuse/Neglect Assessment Can Be Completed: Yes ?Physical Abuse: Denies ?Verbal Abuse: Denies ?Sexual Abuse: Denies ?Exploitation of patient/patient's resources: Denies ?Self-Neglect: Denies ? ?Patient response to: ?Social Isolation - How often do you feel lonely or isolated from those around you?: Never ? ?Emotional Status ?Pt's affect, behavior and adjustment status: Pt in good spirits at time of visit. Worried about being a burden to her father since he is caring for her mother going throughchemo. ?Recent Psychosocial Issues: Worried about mother. ?Psychiatric History: Denies any diagnosis. ?Substance Abuse History: Reportsshe has quit smoking cigarettes and 10 mos abstained. States she quit due to family hx . Denies etoh and reec drug use. ? ?Patient / Family Perceptions, Expectations & Goals ?Pt/Family understanding of illness & functional limitations: Pt has a general understanding of care needs ?Premorbid pt/family roles/activities: Independent; some assistance with IADLs ?Anticipated changes in roles/activities/participation: Assistance with ADLs/IADLs ?Pt/family expectations/goals: pt goal is to gain confidence within and ability to know what to do; regain strength. ? ?[employer] ?Community Agencies: None ?Premorbid Home Care/DME Agencies: None ?Transportation available at discharge: TBD ?Is the patient able to respond to transportation needs?: Yes ?In the past 12 months, has lack of transportation kept you from medical appointments or from getting medications?: No ?In the past 12 months, has lack of transportation kept you from meetings, work, or from getting things needed for daily living?: No ?Resource referrals recommended: Neuropsychology ? ?Discharge Planning ?Living Arrangements: Alone ?Support Systems:  Friends/neighbors, Parent ?Type of Residence: Private residence ?Insurance Resources: Multimedia programmer (specify) Psychologist, counselling) ?Financial Resources: Employment ?Financial Screen Referred: No ?Living Expenses: Rent ?Money Management:  Patient ?Does the patient have any problems obtaining your medications?: No ?Home Management: Pt manages all home care needs ?Patient/Family Preliminary Plans: TBD ?Care Coordinator Barriers to Discharge: Decreased caregiver support, Lack of/limited family support ?Care Coordinator Anticipated Follow Up Needs: HH/OP ?Expected length of stay: 10-14 days ? ?Clinical Impression ?SW met with pt in room to introduce self, explain role and discuss discharge process. 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 No HCPOA. DME: access to RW and can from a friend.  ? ?Rana Snare ?03/20/2022, 11:30 AM ? ?  ?

## 2022-03-20 NOTE — Progress Notes (Signed)
?                                                       PROGRESS NOTE ? ? ?Subjective/Complaints:  ? ?Had small BM yesterday-  ?Ben- PT gave her stretches for neck and upper/mid back yesterday- pain SO much better.  ?Washing hair/in shower- "feels great'.  ? ? ?ROS:  ? ?Pt denies SOB, abd pain, CP, N/V/C/D, and vision changes ? ?Objective: ?  ?No results found. ?Recent Labs  ?  03/19/22 ?0347  ?WBC 7.4  ?HGB 15.0  ?HCT 44.9  ?PLT 290  ? ?Recent Labs  ?  03/19/22 ?4259  ?NA 136  ?K 4.5  ?CL 104  ?CO2 26  ?GLUCOSE 109*  ?BUN 9  ?CREATININE 0.55  ?CALCIUM 9.2  ? ? ?Intake/Output Summary (Last 24 hours) at 03/20/2022 0828 ?Last data filed at 03/19/2022 2128 ?Gross per 24 hour  ?Intake 594 ml  ?Output --  ?Net 594 ml  ?  ? ?  ? ?Physical Exam: ?Vital Signs ?Blood pressure (!) 91/52, pulse 77, temperature 98 ?F (36.7 ?C), resp. rate 17, height 5\' 6"  (1.676 m), weight 108 kg, last menstrual period 03/07/2022, SpO2 97 %. ? ? ?Physical exam: ? ? ?BP 91/52! No Sx's of dizziness/lightheadedness ?General: awake, alert, appropriate, sitting in shower, wearing collar; NAD ?HENT: conjugate gaze; oropharynx moist- wearing cervical collar ?CV: regular rate; no JVD ?Pulmonary: CTA B/L; no W/R/R- good air movement ?GI: soft, NT, ND, (+)BS ?Psychiatric: appropriate ?Neurological: Ox3 ?Skin: Cervical incision clean dry and intact. Dry dressing in place ?Neuro: Alert and oriented x4. Cognition intact.  CN II through XII grossly intact. Decreased sensation throughout all extremities.  Does say that she does have numbness but does feel some improvement in her ability to feel her right thigh.  Reports some numbness of her fingers on both right and left side, with left side more numb especially left ring finger and pinky. ?Musculoskeletal: 4/5 strength throughout.  ? ?Assessment/Plan: ?1. Functional deficits which require 3+ hours per day of interdisciplinary therapy in a comprehensive inpatient rehab setting. ?Physiatrist is providing close  team supervision and 24 hour management of active medical problems listed below. ?Physiatrist and rehab team continue to assess barriers to discharge/monitor patient progress toward functional and medical goals ? ?Care Tool: ? ?Bathing ?   ?Body parts bathed by patient: Right arm, Left arm, Chest, Abdomen, Front perineal area, Buttocks, Right upper leg, Left upper leg, Face, Right lower leg, Left lower leg  ? Body parts bathed by helper: Right lower leg, Left lower leg ?  ?  ?Bathing assist Assist Level: Supervision/Verbal cueing ?  ?  ?Upper Body Dressing/Undressing ?Upper body dressing   ?What is the patient wearing?: Pull over shirt, Orthosis ?   ?Upper body assist Assist Level: Minimal Assistance - Patient > 75% ?   ?Lower Body Dressing/Undressing ?Lower body dressing ? ? ?   ?What is the patient wearing?: Pants ? ?  ? ?Lower body assist Assist for lower body dressing: Supervision/Verbal cueing ?   ? ?Toileting ?Toileting    ?Toileting assist Assist for toileting: Supervision/Verbal cueing ?  ?  ?Transfers ?Chair/bed transfer ? ?Transfers assist ?   ? ?Chair/bed transfer assist level: Contact Guard/Touching assist ?  ?  ?Locomotion ?Ambulation ? ? ?Ambulation assist ? ?   ? ?  Assist level: Contact Guard/Touching assist ?Assistive device: Walker-rolling ?Max distance: 428 ft  ? ?Walk 10 feet activity ? ? ?Assist ?   ? ?Assist level: Contact Guard/Touching assist ?Assistive device: Walker-rolling  ? ?Walk 50 feet activity ? ? ?Assist   ? ?Assist level: Contact Guard/Touching assist ?Assistive device: Walker-rolling  ? ? ?Walk 150 feet activity ? ? ?Assist   ? ?Assist level: Contact Guard/Touching assist ?Assistive device: Walker-rolling ?  ? ?Walk 10 feet on uneven surface  ?activity ? ? ?Assist Walk 10 feet on uneven surfaces activity did not occur: Safety/medical concerns ? ? ?  ?   ? ?Wheelchair ? ? ? ? ?Assist Is the patient using a wheelchair?: Yes ?Type of Wheelchair: Manual ?  ? ?Wheelchair assist level:  Supervision/Verbal cueing ?Max wheelchair distance: 150  ? ? ?Wheelchair 50 feet with 2 turns activity ? ? ? ?Assist ? ?  ?  ? ? ?Assist Level: Supervision/Verbal cueing  ? ?Wheelchair 150 feet activity  ? ? ? ?Assist ?   ? ? ?Assist Level: Supervision/Verbal cueing  ? ?Blood pressure (!) 91/52, pulse 77, temperature 98 ?F (36.7 ?C), resp. rate 17, height 5\' 6"  (1.676 m), weight 108 kg, last menstrual period 03/07/2022, SpO2 97 %. ? ? ? ?Medical Problem List and Plan: ?1. 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            -patient may shower but incision must be covered ?            -ELOS/Goals: modI 10-14 days        ?           Continue CIR- PT, OT  ? Team conference today to determine length of stay ?2.  Antithrombotics: ?-DVT/anticoagulation:  Mechanical: Sequential compression devices, below knee Bilateral lower extremities ?5/8- OR was 6 days ago- will start Lovenox tomorrow ?5/9- started Lovenox 40 mg daily- been 7 days since surgery- went over risks/benefits of not using- pt agreed to Lovenox.  ?            -antiplatelet therapy: N/A ?3. Incisional pain: continue Oxycodone prn ?5/7 continue oxycodone as needed 5 to 10 mg every 4 hours for severe pain also may utilize Robaxin 500 mg tablet every 6 hours for muscle spasms ? 5/9- pain much better with stretches from PT- con't regimen ?4. Mood: LCSW to follow for evaluation and support.  ?            -antipsychotic agents: N/A ?5. Neuropsych: This patient is capable of making decisions on her own behalf. ?6. Skin/Wound Care: Routine pressure relief measures. ?5/8- change Honeycomb to dry dressing today and change daily.   ?7. Fluids/Electrolytes/Nutrition: Monitor I/O. Check CMET in am ?8. ADHD/Anxiety: In the past-->no meds for years. ?9.  Myalgias: add kpad, continue prn robaxin.  ?5/7 continue utilize Robaxin 500 mg tablet every 6 hours for muscle spasms ?10. Morbid obesity: BMI 38.41: provide  dietary education ?5/7 continue dietary education as well as physical therapy and progressive exercise ?11. Burning thoracic pain; lumbar MRI imaging suggestive of thoracic spine pedicle fractures: CT ordered to further assess. ?5/5 MRI findings are described as below: ?IMPRESSION: ?1. T1 hypointensity with associated T2/STIR hyperintensity in the ?bilateral T12 pedicles, right more than left, raise suspicion for ?acute to subacute fractures. Correlate with point tenderness, and ?consider CT to evaluate for a fracture plane. ?2. Prominent central protrusion at L4-L5 which narrows the ?subarticular zones and  may irritate the traversing right L5 nerve ?root. ?3. Overall mild degenerative changes throughout the remainder of the ?lumbar spine as above without significant spinal canal or neural ?foraminal stenosis. ?5/6 CT today showed results that did not indicate fracture. ?Impression findings are as described below: ?1. Partially visible postoperative soft tissue inflammation in the ?lower neck status post recent cervical ACDF. Minimal associated ?inflammation in the superior mediastinum. ?2. No acute osseous abnormality in the thoracic spine. 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 And degenerative appearing sclerosis of ?the bilateral T12 pedicles and costovertebral junctions. ?3. 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 ?-5/6 May continue to utilize pain strategies as outlined above. ? 5/8- no surgery required- went over CT independently and also went over results with pt.  ?12. Constipation: Senna 2 tabs HS ?-5/6 continue senna 2 tabs at bedtime continue mobility to promote bowel function.  May utilize as needed's if needed does have available MiraLAX 17 g as needed  for mild constipation Dulcolax suppository 10 mg for moderate constipation and a fleets enema for severe constipation. ?-LBM charted as yesterday 03/16/2022. ?5/7 LBM today, pt reports she feels relieved. ?  5/

## 2022-03-20 NOTE — Progress Notes (Signed)
Occupational Therapy Session Note ? ?Patient Details  ?Name: Alexandra Schmitt ?MRN: [id number] ?Date of Birth: May 07, 1981 ? ?Today's Date: 03/20/2022 ?OT Individual Time: 2094-7096 ?OT Individual Time Calculation (min): 28 min  ? ? ?Short Term Goals: ?Week 1:  OT Short Term Goal 1 (Week 1): Pt will improve R hand grip strength to 40# and L hand to 20# to improve functional use ?OT Short Term Goal 2 (Week 1): Pt will perform 3/3 toileting tasks with CGA ?OT Short Term Goal 3 (Week 1): Pt will perform LB ADL with CGA using AE PRN ?OT Short Term Goal 4 (Week 1): Pt will perform UB dress over collar with Set up ? ?Skilled Therapeutic Interventions/Progress Updates:  ?  OT intervention with focus on BUE/hand strengthing and dexterity. Pt issed red theraputty. Pt educated on theraputty exercises to improve pinch and grasp strength on RUE and LUE. Pt requested use of toilet and amb with RW to bathroom with supervision. Toileting with supervision. Pt returned to bed. Pt remained in bed with all needs within reach and bed alarm activated.  ? ?Therapy Documentation ?Precautions:  ?Precautions ?Precautions: Cervical ?Precaution Booklet Issued: No ?Precaution Comments: verbally reviewed cervical precautions with pt ?Required Braces or Orthoses: Cervical Brace ?Cervical Brace: Hard collar, At all times ?Restrictions ?Weight Bearing Restrictions: No ?Pain: ? Pt c/o 5/10 pain in neck and upper back; repositioned ? ? ?Therapy/Group: Individual Therapy ? ?Rich Brave ?03/20/2022, 2:49 PM ?

## 2022-03-20 NOTE — Progress Notes (Signed)
Physical Therapy Session Note ? ?Patient Details  ?Name: Alexandra Schmitt ?MRN: [id number] ?Date of Birth: 1981-09-27 ? ?Today's Date: 03/20/2022 ?PT Individual Time: 8921-1941; 1130-1200 ?PT Individual Time Calculation (min): 75 min and 30 min ? ?Short Term Goals: ?Week 1:  PT Short Term Goal 1 (Week 1): pt will perform stairs x 12 with 1 hand rail and CGA ?PT Short Term Goal 2 (Week 1): Pt will ambulate with supervision >150 ft ?PT Short Term Goal 3 (Week 1): Pt will improve TUG score by MCID of 5.7 sec ? ?Skilled Therapeutic Interventions/Progress Updates:  ?  Session 1: ?Pt received seated in recliner in room, agreeable to PT session. Pt reports some ongoing pain down into her low back but reports stretches she did in PT session yesterday helped with pain. Pt also reports being premedicated for pain prior to start of therapy session. Sit to stand with close Supervision to CGA with use of RW during session. Ambulation up to 400 ft during session with RW and CGA for balance, cues for upright posture and decreased UE reliance on RW. Trial gait with no AD, 4 x 70 ft with min A with gradual addition of B arm swing during gait. Pt initially unsteady on her feet during gait with no AD but gains confidence with progression. Ascend/descend 12 x 6" stairs x 2 reps, initially with 2 handrails and CGA, progression to just R handrail forwards with min A, and finally laterally with R handrail with CGA. Pt reports prior to admission she was navigating stairs laterally with just R handrail per home setup. Encouraged continued practice of stairs via this method. Also discussed placing a RW at top and bottom of stairs so she won't have to manage equipment on stairs. Ascend/descend one 6" curb step with RW x 8 reps with min A, cues for safe sequencing of transfer. Pt also reports tightness in R HS and calves, demonstrated how to perform seated HS stretch with use of gait belt, pt able to setup and perform stretch independently  following demonstration. Standing gastroc stretch on wedge 3 x 45 sec with RW. Pt reports good relief of muscle tightness following stretches. Piriformis stretch in supine and sitting 3 x 45 sec each. Pt returned to room and requests to return to bed, Supervision for bed mobility. Pt left supine in bed with needs in reach, bed alarm in place. ? ?Session 2: ?Pt received seated in recliner in room, agreeable to PT session. Pt reports pain is well-managed this AM. Sit to stand with CGA to RW during session. Ambulation up to 150 ft with RW and CGA during session. Session focus on standing balance, LE coordination, and LE strengthening. Standing alt L/R cone taps with RW and CGA for balance, 2 x 10 reps, increased time needed for control of RLE. Standing alt L/R 4" step-ups with RW and min A for balance, 2 x 10 reps. Sidesteps L/R 2 x 10 ft each direction with RW and CGA for balance. Forwards/backwards gait with one UE support on handrail with min A for balance, 4 x 30 ft for hip strengthening and balance training. Pt requests to return to bed at end of session due to fatigue. Sit to supine Supervision. Pt left supine in bed with needs in reach, bed alarm in place. ? ?Therapy Documentation ?Precautions:  ?Precautions ?Precautions: Cervical ?Precaution Booklet Issued: No ?Precaution Comments: verbally reviewed cervical precautions with pt ?Required Braces or Orthoses: Cervical Brace ?Cervical Brace: Hard collar, At all times ?Restrictions ?Weight  Bearing Restrictions: No ? ? ? ? ? ?Therapy/Group: Individual Therapy ? ? ?Peter Congo, PT, DPT, CSRS ?03/20/2022, 12:14 PM  ?

## 2022-03-20 NOTE — Patient Care Conference (Signed)
Inpatient RehabilitationTeam Conference and Plan of Care Update ?Date: 03/20/2022   Time: 11:08 AM  ? ? ?Patient Name: Alexandra Schmitt      ?Medical Record Number: [id number]  ?Date of Birth: 1981/08/13 ?Sex: Female         ?Room/Bed: 4W01C/4W01C-01 ?Payor Info: Payor: [employer] / Plan: Optician, dispensing / Product Type: *No Product type* /   ? ?Admit Date/Time:  03/16/2022  3:21 PM ? ?Primary Diagnosis:  Acute incomplete quadriplegia ([facility]) ? ?Hospital Problems: Principal Problem: ?  Acute incomplete quadriplegia ([facility]) ?Active Problems: ?  Cervical myelopathy ([facility]) ? ? ? ?Expected Discharge Date: Expected Discharge Date: 03/28/22 ? ?Team Members Present: ?Physician leading conference: Dr. Genice Rouge ?Social Worker Present: Cecile Sheerer, LCSWA ?Nurse Present: Kennyth Arnold, RN ?PT Present: Peter Congo, PT ?OT Present: Ardis Rowan, Jaynee Eagles, OT ?PPS Coordinator present : Fae Pippin, SLP ? ?   Current Status/Progress Goal Weekly Team Focus  ?Bowel/Bladder ? ? continent b/b  remain continent  toilet as needed   ?Swallow/Nutrition/ Hydration ? ?           ?ADL's ? ? bathing/dressing-min A; functional tranfsers-CGA/supervision; toileting-supervision  mod I overall  safety awreness, IADLs, functional tranfsers, education   ?Mobility ? ? Supervision bed mobility, CGA to min A gait and transfers with RW, min A stairs with 2 handrails (has 0-1 at home)  mod I overall  transfers, gait, stairs   ?Communication ? ?           ?Safety/Cognition/ Behavioral Observations ?           ?Pain ? ? some pain reports, Oxy IR available  < 3  assess pain q 4 hr and prn   ?Skin ? ? neck incision, CDI  no new breakdown or infection  assess skin q shift and prn   ? ? ?Discharge Planning:  ?Pt will d/c to home with intermittent support from her neighbor who lives in the apartment building across from her. PRN support from friends/father to medical appointments, and meals.   ?Team Discussion: ?ASIA-D. Pain improved. Thoracic CT  negative for fracture. Bowels improving. Continent B/B, some pain reported. Oxy IR available q 4 hrs. Neck incision with no s/s of infection. Intermittent support from family and friends at discharge. ? ?Patient on target to meet rehab goals: ?yes, mod I goals. Currently supervision to CGA. > 400 ft with RW. CGA on stairs. Showered today with CGA to supervision assist. ? ?*See Care Plan and progress notes for long and short-term goals.  ? ?Revisions to Treatment Plan:  ?None at this time. ?  ?Teaching Needs: ?Family education, medication/pain management, skin/wound care, transfer/gait training, etc. ?  ?Current Barriers to Discharge: ?Decreased caregiver support, Home enviroment access/layout, Wound care, Lack of/limited family support, Weight bearing restrictions, and stairs. ? ?Possible Resolutions to Barriers: ?Family education ?Order recommended DME ?Follow-up therapy ?  ? ? Medical Summary ?Current Status: ASIA D quad- continent B/B- no neurogen ic B/B- has Oxy prn- neck incision/staples- ? Barriers to Discharge: Home enviroment access/layout;Medical stability;Weight;Weight bearing restrictions;Wound care ? Barriers to Discharge Comments: will need to be mod I- intermittent support-neighbor to help some ?Possible Resolutions to [employer]: walking 462ft- biggest barrier lots of stairs ot enter home; mod I goals-5/17- d/c- ? ? ?Continued Need for Acute Rehabilitation Level of Care: The patient requires daily medical management by a physician with specialized training in physical medicine and rehabilitation for the following reasons: ?Direction of a multidisciplinary physical rehabilitation program to maximize  functional independence : Yes ?Medical management of patient stability for increased activity during participation in an intensive rehabilitation regime.: Yes ?Analysis of laboratory values and/or radiology reports with any subsequent need for medication adjustment and/or medical intervention. :  Yes ? ? ?I attest that I was present, lead the team conference, and concur with the assessment and plan of the team. ? ? ?Kennyth Arnold G ?03/20/2022, 3:31 PM  ? ? ? ? ? ? ?

## 2022-03-21 DIAGNOSIS — G8250 Quadriplegia, unspecified: Secondary | ICD-10-CM

## 2022-03-21 NOTE — Progress Notes (Signed)
Occupational Therapy Session Note ? ?Patient Details  ?Name: Alexandra Schmitt ?MRN: [id number] ?Date of Birth: [birth year] ? ?Today's Date: 03/21/2022 ?OT Individual Time: 1219-7588 ?OT Individual Time Calculation (min): 70 min  ? ? ?Short Term Goals: ?Week 1:  OT Short Term Goal 1 (Week 1): Pt will improve R hand grip strength to 40# and L hand to 20# to improve functional use ?OT Short Term Goal 2 (Week 1): Pt will perform 3/3 toileting tasks with CGA ?OT Short Term Goal 3 (Week 1): Pt will perform LB ADL with CGA using AE PRN ?OT Short Term Goal 4 (Week 1): Pt will perform UB dress over collar with Set up ? ?Skilled Therapeutic Interventions/Progress Updates:  ?  Pt resting in bed upon arrival. Pt declined shower this morning but did request to use toilet. Supine>sit EOB with supervision and amb with RW to bathroom with supervision. Toileting with supervision. Pt stood at sink to wash hands and brush teeth before amb with RW to closet to select clothing. Dressing with sit<>stand from EOB-supervision. Pt amb with RW to gym and practiced accessing elevated surface to simulate bed height at home. Pt used 4' step to access. Pt amb with RW to ADL kitchen. Secondary school teacher and education. Problem solving how to manage at home. Pt commented that she will purchase reacher. Pt returned to room and remained seated in relciner. Belt alarm activated. All needs within reach.  ? ?Therapy Documentation ?Precautions:  ?Precautions ?Precautions: Cervical ?Precaution Booklet Issued: No ?Precaution Comments: verbally reviewed cervical precautions with pt ?Required Braces or Orthoses: Cervical Brace ?Cervical Brace: Hard collar, At all times ?Restrictions ?Weight Bearing Restrictions: No ?Pain: ? Pt reports soreness around border of c-collar and "muscles I haven't used in awhile."; activity and repositioned ? ? ?Therapy/Group: Individual Therapy ? ?Rich Brave ?03/21/2022, 8:12 AM ?

## 2022-03-21 NOTE — Progress Notes (Signed)
Physical Therapy Session Note ? ?Patient Details  ?Name: Alexandra Schmitt ?MRN: [id number] ?Date of Birth: 06-Sep-1981 ? ?Today's Date: 03/21/2022 ?PT Individual Time: 3300-7622; 1300-1405 ?PT Individual Time Calculation (min): 55 min and 65 min ? ?Short Term Goals: ?Week 1:  PT Short Term Goal 1 (Week 1): pt will perform stairs x 12 with 1 hand rail and CGA ?PT Short Term Goal 2 (Week 1): Pt will ambulate with supervision >150 ft ?PT Short Term Goal 3 (Week 1): Pt will improve TUG score by MCID of 5.7 sec ? ?Skilled Therapeutic Interventions/Progress Updates:  ?  Session 1: ?Pt received seated in recliner in room, agreeable to PT session. Pt reports ongoing muscle soreness and tightness, utilized stretching and repositioning throughout session for pain management. Pt requesting pain medication and muscle relaxer from nursing at end of session. Sit to stand with close Supervision to CGA to RW during session. Ambulation 2 x 200 ft with RW and close Supervision to CGA. Pt reports sensation that R knee is hyperextending during gait, no visible knee hyperextension noted. Seated RLE HS stretch with gait belt 3 x 60 sec, piriformis stretch 3 x 30 sec in figure 4 position, and standing B gastroc stretch on blue therapy wedge 3 x 60 sec. Ascend/descend 12 x 6" stairs laterally with R handrail and CGA. Pt reports improved confidence with stair navigation this date. Standing alt L/R 6" step-ups with B handrails and CGA for balance, 3 x 10 reps for LE strengthening. Focused on keeping head upright during task. Pt reports more difficulty controlling RLE as compared to LLE. Standing squats 3 x 10 reps with RW and CGA for balance, use of mirror and verbal feedback for equal WB through BLE as pt tends to favor LLE due to RLE weakness. Pt requests to return to bed at end of session. Sit to supine Supervision. Pt left seated in bed with needs in reach, bed alarm in place. ? ?Session 2: ?Pt received seated in bed, agreeable to PT  session. Pt reports muscle soreness from AM session, no complaints of pain otherwise. Pt reports urgent need to urinate. Bed mobility Supervision. Sit to stand with RW and Supervision. Ambulatory transfer into bathroom with RW and CGA. Toilet transfer with RW and Supervision, pt is independent for 3/3 toileting steps. Pt able to don shoes while seated EOB with setup A. Pt agreeable to ambulate outdoors this PM. Ambulation up to 1000 ft this session with use of RW outdoors across uneven ground, up/down inclines, and in busy functional environments. Pt requires some cues for safety with RW management over uneven ground and to not let go of RW when gesturing during conversation. Ascend/descend 12 x 6" stairs with R handrail then with L handrail and min A for balance especially when ascending with RLE and L handrail. Pt requires seated rest break between each bout of stair navigation. Pt returned to therapy gym for remainder of session. Standing RLE TKE with red theraband, 2 x 10 reps for quad control and strengthening as pt reports she feels knee hyperextending at times during gait. Trial gait with no AD initally x 70 ft with progression to 150 ft with CGA for balance. Pt exhibits improved gait speed and balance without use of AD this date but does exhibit onset of fatigue towards end of session with decreased LE control. Pt returns to bed at end of session, Supervision for bed mobility. Co-treatment session with RT with focus on return to meaningful activities. ? ?Therapy Documentation ?  Precautions:  ?Precautions ?Precautions: Cervical ?Precaution Booklet Issued: No ?Precaution Comments: verbally reviewed cervical precautions with pt ?Required Braces or Orthoses: Cervical Brace ?Cervical Brace: Hard collar, At all times ?Restrictions ?Weight Bearing Restrictions: No ? ? ? ? ? ?Therapy/Group: Individual Therapy ? ? ?Peter Congo, PT, DPT, CSRS ?03/21/2022, 12:22 PM  ?

## 2022-03-21 NOTE — Progress Notes (Signed)
?                                                       PROGRESS NOTE ? ? ?Subjective/Complaints:  ? ?Pt reports "normal aches and pains".  ?LBM yesterday.,  ? ? ?ROS:  ? ?Pt denies SOB, abd pain, CP, N/V/C/D, and vision changes ? ?Objective: ?  ?No results found. ?Recent Labs  ?  03/19/22 ?5456  ?WBC 7.4  ?HGB 15.0  ?HCT 44.9  ?PLT 290  ? ?Recent Labs  ?  03/19/22 ?2563  ?NA 136  ?K 4.5  ?CL 104  ?CO2 26  ?GLUCOSE 109*  ?BUN 9  ?CREATININE 0.55  ?CALCIUM 9.2  ? ? ?Intake/Output Summary (Last 24 hours) at 03/21/2022 0807 ?Last data filed at 03/20/2022 2143 ?Gross per 24 hour  ?Intake 1060 ml  ?Output --  ?Net 1060 ml  ?  ? ?  ? ?Physical Exam: ?Vital Signs ?Blood pressure 95/61, pulse 82, temperature 97.7 ?F (36.5 ?C), resp. rate 20, height 5\' 6"  (1.676 m), weight 108 kg, last menstrual period 03/07/2022, SpO2 98 %. ? ? ?Physical exam: ? ? ? ?General: awake, alert, appropriate, sitting up in bed; smiling; NAD ?HENT: conjugate gaze; oropharynx moist- wearing cervical collar- dry dressing on anterior neck; ?CV: regular rate; no JVD ?Pulmonary: CTA B/L; no W/R/R- good air movement ?GI: soft, NT, ND, (+)BS ?Psychiatric: appropriate ?Neurological: Ox3 ? ? ?Skin: Cervical incision clean dry and intact. Dry dressing in place ?Neuro: Alert and oriented x4. Cognition intact.  CN II through XII grossly intact. Decreased sensation throughout all extremities.  Does say that she does have numbness but does feel some improvement in her ability to feel her right thigh.  Reports some numbness of her fingers on both right and left side, with left side more numb especially left ring finger and pinky. ?Musculoskeletal: 4/5 strength throughout.  ? ?Assessment/Plan: ?1. Functional deficits which require 3+ hours per day of interdisciplinary therapy in a comprehensive inpatient rehab setting. ?Physiatrist is providing close team supervision and 24 hour management of active medical problems listed below. ?Physiatrist and rehab team continue  to assess barriers to discharge/monitor patient progress toward functional and medical goals ? ?Care Tool: ? ?Bathing ?   ?Body parts bathed by patient: Right arm, Left arm, Chest, Abdomen, Front perineal area, Buttocks, Right upper leg, Left upper leg, Face, Right lower leg, Left lower leg  ? Body parts bathed by helper: Right lower leg, Left lower leg ?  ?  ?Bathing assist Assist Level: Supervision/Verbal cueing ?  ?  ?Upper Body Dressing/Undressing ?Upper body dressing   ?What is the patient wearing?: Pull over shirt, Orthosis ?   ?Upper body assist Assist Level: Minimal Assistance - Patient > 75% ?   ?Lower Body Dressing/Undressing ?Lower body dressing ? ? ?   ?What is the patient wearing?: Pants ? ?  ? ?Lower body assist Assist for lower body dressing: Supervision/Verbal cueing ?   ? ?Toileting ?Toileting    ?Toileting assist Assist for toileting: Supervision/Verbal cueing ?  ?  ?Transfers ?Chair/bed transfer ? ?Transfers assist ?   ? ?Chair/bed transfer assist level: Contact Guard/Touching assist ?  ?  ?Locomotion ?Ambulation ? ? ?Ambulation assist ? ?   ? ?Assist level: Contact Guard/Touching assist ?Assistive device: Walker-rolling ?Max distance: 400'  ? ?Walk 10  feet activity ? ? ?Assist ?   ? ?Assist level: Contact Guard/Touching assist ?Assistive device: Walker-rolling  ? ?Walk 50 feet activity ? ? ?Assist   ? ?Assist level: Contact Guard/Touching assist ?Assistive device: Walker-rolling  ? ? ?Walk 150 feet activity ? ? ?Assist   ? ?Assist level: Contact Guard/Touching assist ?Assistive device: Walker-rolling ?  ? ?Walk 10 feet on uneven surface  ?activity ? ? ?Assist Walk 10 feet on uneven surfaces activity did not occur: Safety/medical concerns ? ? ?  ?   ? ?Wheelchair ? ? ? ? ?Assist Is the patient using a wheelchair?: Yes ?Type of Wheelchair: Manual ?  ? ?Wheelchair assist level: Supervision/Verbal cueing ?Max wheelchair distance: 150  ? ? ?Wheelchair 50 feet with 2 turns activity ? ? ? ?Assist ? ?  ?   ? ? ?Assist Level: Supervision/Verbal cueing  ? ?Wheelchair 150 feet activity  ? ? ? ?Assist ?   ? ? ?Assist Level: Supervision/Verbal cueing  ? ?Blood pressure 95/61, pulse 82, temperature 97.7 ?F (36.5 ?C), resp. rate 20, height 5\' 6"  (1.676 m), weight 108 kg, last menstrual period 03/07/2022, SpO2 98 %. ? ? ? ?Medical Problem List and Plan: ?1. 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            -patient may shower but incision must be covered ?            -ELOS/Goals: modI 10-14 days        ?           Continue CIR- PT, OT  ? D/c date 03/28/22 ?2.  Antithrombotics: ?-DVT/anticoagulation:  Mechanical: Sequential compression devices, below knee Bilateral lower extremities ?5/8- OR was 6 days ago- will start Lovenox tomorrow ?5/9- started Lovenox 40 mg daily- been 7 days since surgery- went over risks/benefits of not using- pt agreed to Lovenox.  ?            -antiplatelet therapy: N/A ?3. Incisional pain: continue Oxycodone prn ?5/7 continue oxycodone as needed 5 to 10 mg every 4 hours for severe pain also may utilize Robaxin 500 mg tablet every 6 hours for muscle spasms ? 5/9- pain much better with stretches from PT- con't regimen ? 5/10- normal aches and pains- but controlled overall- con't regimen ?4. Mood: LCSW to follow for evaluation and support.  ?            -antipsychotic agents: N/A ?5. Neuropsych: This patient is capable of making decisions on her own behalf. ?6. Skin/Wound Care: Routine pressure relief measures. ?5/8- change Honeycomb to dry dressing today and change daily.   ?7. Fluids/Electrolytes/Nutrition: Monitor I/O. Check CMET in am ?8. ADHD/Anxiety: In the past-->no meds for years. ?9.  Myalgias: add kpad, continue prn robaxin.  ?5/7 continue utilize Robaxin 500 mg tablet every 6 hours for muscle spasms ?10. Morbid obesity: BMI 38.41: provide dietary education ?5/7 continue dietary education as well as physical therapy and  progressive exercise ?11. Burning thoracic pain; lumbar MRI imaging suggestive of thoracic spine pedicle fractures: CT ordered to further assess. ?5/5 MRI findings are described as below: ?IMPRESSION: ?1. T1 hypointensity with associated T2/STIR hyperintensity in the ?bilateral T12 pedicles, right more than left, raise suspicion for ?acute to subacute fractures. Correlate with point tenderness, and ?consider CT to evaluate for a fracture plane. ?2. Prominent central protrusion at L4-L5 which narrows the ?subarticular zones and may irritate the traversing right L5 nerve ?root. ?3. Overall mild  degenerative changes throughout the remainder of the ?lumbar spine as above without significant spinal canal or neural ?foraminal stenosis. ?5/6 CT today showed results that did not indicate fracture. ?Impression findings are as described below: ?1. Partially visible postoperative soft tissue inflammation in the ?lower neck status post recent cervical ACDF. Minimal associated ?inflammation in the superior mediastinum. ?2. No acute osseous abnormality in the thoracic spine. 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 And degenerative appearing sclerosis of ?the bilateral T12 pedicles and costovertebral junctions. ?3. 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 ?-5/6 May continue to utilize pain strategies as outlined above. ? 5/8- no surgery required- went over CT independently and also went over results with pt.  ?12. Constipation: Senna 2 tabs HS ?-5/6 continue senna 2 tabs at bedtime continue mobility to promote bowel function.  May utilize as needed's if needed does have available MiraLAX 17 g as needed for mild constipation Dulcolax suppository 10 mg for moderate constipation and a  fleets enema for severe constipation. ?-LBM charted as yesterday 03/16/2022. ?5/7 LBM today, pt reports she feels relieved. ?  5/8- LBM yesterday- not constipated anymore ? 5/9- LBM-s mall BM yesterday- thinks s

## 2022-03-21 NOTE — Evaluation (Signed)
Recreational Therapy Assessment and Plan ? ?Patient Details  ?Name: Alexandra Schmitt ?MRN: [id number] ?Date of Birth: [birth year] ?Today's Date: 03/21/2022 ? ?Rehab Potential:  Good ?ELOS:   d/c 5/17 ? ?Assessment  ?Hospital Problem: Principal Problem: ?  Cervical myelopathy ([place]) ?  ?  ?Past Medical History:  ?    ?Past Medical History:  ?Diagnosis Date  ? ADHD (attention deficit hyperactivity disorder)    ? Anxiety    ?  ?Past Surgical History:  ?     ?Past Surgical History:  ?Procedure Laterality Date  ? ANTERIOR CERVICAL DECOMP/DISCECTOMY FUSION N/A 03/13/2022  ?  Procedure: Cervical four-five, Cervical five-six Anterior Cervical Decompression Discectomy Fusion;  Surgeon: Dawley, Theodoro Doing, DO;  Location: [facility];  Service: Neurosurgery;  Laterality: N/A;  ? BROKEN NOSE      ? RIGHT FOOT RECONSTRUCTION      ? TONSILLECTOMY      ?  ?  ?Assessment & Plan ?Clinical Impression: Alexandra Schmitt is a 41 year old female with history of ADHD, depression/anxiety d/o, fall 02/12/24 followed by BUE weakness, numbness, tingling and difficulty walking. She was evaluated by Dr. 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 She was admitted on 03/13/22 for ACDF C4-C6 with cervical collar to be worn at all times. PT/OT ongoing and patient limited by balance deficits with unsteady gait, sensory deficits BUE/BLE, weakness as well as neck/shoulder pain. CIR recommended due to recent functional decline.  Currently has incisional painPatient transferred to CIR on 03/16/2022.    ? ?Pt presents with decreased activity tolerance, decreased functional mobility, decreased balance, decreased coordination, feelings of stress Limiting pt's independence with leisure/community pursuits. ? ? ?Met with pt today to discuss TR services including leisure education, activity analysis/modifications, community reintegration and stress management.  Also discussed the importance of social, emotional,  spiritual health in addition to physical health and their effects on overall health and wellness.  Pt stated understanding.  Pt agreeable to participate in an outing tomorrow. ? ?Plan ? Min 1 TR session per week during LOS ? ?Recommendations for other services: None  ? ?Discharge Criteria: Patient will be discharged from TR if patient refuses treatment 3 consecutive times without medical reason.  If treatment goals not met, if there is a change in medical status, if patient makes no progress towards goals or if patient is discharged from hospital. ? ?The above assessment, treatment plan, treatment alternatives and goals were discussed and mutually agreed upon: by patient ? ?Alexandra Schmitt ?03/21/2022, 3:58 PM  ?

## 2022-03-22 NOTE — Progress Notes (Signed)
Physical Therapy Session Note ? ?Patient Details  ?Name: Alexandra Schmitt ?MRN: [id number] ?Date of Birth: 06-Sep-1981 ? ?Today's Date: 03/22/2022 ?PT Individual Time: [id number] ?PT Individual Time Calculation (min): 70 min  ? ?Short Term Goals: ?Week 1:  PT Short Term Goal 1 (Week 1): pt will perform stairs x 12 with 1 hand rail and CGA ?PT Short Term Goal 2 (Week 1): Pt will ambulate with supervision >150 ft ?PT Short Term Goal 3 (Week 1): Pt will improve TUG score by MCID of 5.7 sec ? ?Skilled Therapeutic Interventions/Progress Updates:  ?  Pt received seated in recliner in room, agreeable to PT session. No complaints of pain, does report soreness from previous day's therapy sessions. Sit to stand with Supervision with RW throughout session. Ambulation up to 400 ft with RW at Supervision level. Pt reports she does have some more soreness in her lower back region this AM. Sit to/from supine with Supervision on flat mat table. Supine LTR x 15 reps, SKTC stretch 4 x 30-45 sec each, and pelvic tilts x 10 reps with 5 sec hold. Pt exhibits increased tightness in RLE as compared to LLE and reports it feels like she is "laying on tennis balls". Performed STM and TPR to R gluteal muscles with multiple trigger points palpated. Pt reports some relief of soreness follow therex and manual therapy. Ascend/descend 12 x 6" stairs laterally with R handrail with CGA, pt able to keep head upright during stair navigation but does require increased time and cueing for safe LE management on stairs. Ambulation up/down ramp, across uneven surface of mulch, and up/down 6" curb step with RW and CGA for safety. Sidesteps L/R with no AD and CGA 3 x 15 ft each direction. Addition of cone-taps with side-steps L/R with min A 3 x 10 ft each direction. Standing squats with cone retrieval from floor/placement on floor x 20 reps with min A for balance and intermittent UE support on handrail. Ambulation x 150 ft with no AD and CGA at end of session  with decreased LE control and balance noted due to fatigue. Toilet transfer with RW and Supervision. Pt returned to bed at end of session, Supervision for bed mobility. Pt left seated in bed with needs in reach, bed alarm in place. ? ?Therapy Documentation ?Precautions:  ?Precautions ?Precautions: Cervical ?Precaution Booklet Issued: No ?Precaution Comments: verbally reviewed cervical precautions with pt ?Required Braces or Orthoses: Cervical Brace ?Cervical Brace: Hard collar, At all times ?Restrictions ?Weight Bearing Restrictions: No ? ? ? ? ? ? ?Therapy/Group: Individual Therapy ? ? ?Excell Seltzer, PT, DPT, CSRS ?03/22/2022, 12:21 PM  ?

## 2022-03-22 NOTE — Plan of Care (Signed)
?  Problem: Consults ?Goal: RH GENERAL PATIENT EDUCATION ?Description: See Patient Education module for education specifics. ?Outcome: Progressing ?Goal: Skin Care Protocol Initiated - if Braden Score 18 or less ?Description: If consults are not indicated, leave blank or document N/A ?Outcome: Progressing ?  ?Problem: RH BOWEL ELIMINATION ?Goal: RH STG MANAGE BOWEL WITH ASSISTANCE ?Description: STG Manage Bowel with Supervision Assistance. ?Outcome: Progressing ?Goal: RH STG MANAGE BOWEL W/MEDICATION W/ASSISTANCE ?Description: STG Manage Bowel with Medication with Supervision Assistance. ?Outcome: Progressing ?  ?Problem: RH SKIN INTEGRITY ?Goal: RH STG MAINTAIN SKIN INTEGRITY WITH ASSISTANCE ?Description: STG Maintain Skin Integrity With Supervision Assistance. ?Outcome: Progressing ?Goal: RH STG ABLE TO PERFORM INCISION/WOUND CARE W/ASSISTANCE ?Description: STG Able To Perform Incision/Wound Care With Supervision Assistance. ?Outcome: Progressing ?  ?Problem: RH SAFETY ?Goal: RH STG ADHERE TO SAFETY PRECAUTIONS W/ASSISTANCE/DEVICE ?Description: STG Adhere to Safety Precautions With Cues and Reminders. ?Outcome: Progressing ?Goal: RH STG DECREASED RISK OF FALL WITH ASSISTANCE ?Description: STG Decreased Risk of Fall With Supervision Assistance. ?Outcome: Progressing ?  ?Problem: RH PAIN MANAGEMENT ?Goal: RH STG PAIN MANAGED AT OR BELOW PT'S PAIN GOAL ?Description: < 3 on a 0-10 pain scale. ?Outcome: Progressing ?  ?Problem: RH KNOWLEDGE DEFICIT GENERAL ?Goal: RH STG INCREASE KNOWLEDGE OF SELF CARE AFTER HOSPITALIZATION ?Description: Patient will demonstrate knowledge of self-care management, medication/pain management, skin/wound care with educational materials and handouts provided by staff independently at discharge. ?Outcome: Progressing ?  ?

## 2022-03-22 NOTE — Progress Notes (Signed)
Occupational Therapy Session Note ? ?Patient Details  ?Name: Alexandra Schmitt ?MRN: [id number] ?Date of Birth: 05/27/1981 ? ?Today's Date: 03/22/2022 ?OT Individual Time: [phone] ?OT Individual Time Calculation (min): 73 min  ? ? ?Short Term Goals: ?Week 1:  OT Short Term Goal 1 (Week 1): Pt will improve R hand grip strength to 40# and L hand to 20# to improve functional use ?OT Short Term Goal 2 (Week 1): Pt will perform 3/3 toileting tasks with CGA ?OT Short Term Goal 3 (Week 1): Pt will perform LB ADL with CGA using AE PRN ?OT Short Term Goal 4 (Week 1): Pt will perform UB dress over collar with Set up ? ?Skilled Therapeutic Interventions/Progress Updates:  ?  OT intervention with focus on functional amb with RW, bathing at shower level, dressing with sit<>stand from EOB, BUE FMC and handwriting, discharge planning, and activity tolerance to increase independence with BADLs. Pt amb with RW in room to gather clothing before walking into bathroom for toileting and shower. Pt completed shower with supervision and donned hospital gown before returning to room for dressing. Dressing with supervision. Pt able to don sports bra with supervision. Pt donned socks/shoes and fastened shoe laces with supervision. Pt reports varying levels of sensation in hands and UE. Pt amb with RW to day room and engaged in theraputty task with small beads. Pt also practiced handwriting with foam tube to facilitate improved grasp. Pt returned to room and sat in relciner. All needs within reach and belt alarm activated.  ? ?Therapy Documentation ?Precautions:  ?Precautions ?Precautions: Cervical ?Precaution Booklet Issued: No ?Precaution Comments: verbally reviewed cervical precautions with pt ?Required Braces or Orthoses: Cervical Brace ?Cervical Brace: Hard collar, At all times ?Restrictions ?Weight Bearing Restrictions: No ?Pain: ? Pt reports general muscle soreness; shower and activity ? ?Therapy/Group: Individual Therapy ? ?Rich Brave ?03/22/2022, 8:14 AM ?

## 2022-03-22 NOTE — Progress Notes (Signed)
Recreational Therapy Session Note ? ?Patient Details  ?Name: Alexandra Schmitt ?MRN: [id number] ?Date of Birth: 09-21-1981 ?Today's Date: 03/22/2022 ? ?Pain: no c/o ?Skilled Therapeutic Interventions/Progress Updates: Pt participated in Community reintegration/outing to [employer] coffee shop at overall supervision level with the exception of min assist needed on [employer] steps for safety.  Goals focused on safe community mobility, identification & negotiation of obstacles, accessing public restroom, energy conservation techniques/education and discharge planning.  See outing goal sheet in shadow chart for full details. ? ? ?Therapy/Group: Community Reintegration ? ? ?[name] ?03/22/2022, 12:41 PM  ?

## 2022-03-22 NOTE — Progress Notes (Addendum)
Patient ID: Alexandra Schmitt, female   DOB: 05/25/1981, 41 y.o.   MRN: [id number] ? ?SW met with pt in room to to discuss d/c recommendations: RW and Outpatient PT/OT.  SW will order RW and outpatient referral will be sent to [facility] (p:[phone]/f:[phone]). Pt report ? ?SW ordered RW with [facility] via parachute, and faxed outpatient PT/OT referral to [place].  ? ?*SW received phone call from Wendy/[facility] reporting referral received and will f/u with pt to schedule once she is discharged.  ? ?Loralee Pacas, MSW, LCSWA ?Office: [phone] ?Cell: [phone] ?Fax: [phone]  ?

## 2022-03-22 NOTE — Progress Notes (Signed)
Patient ID: Alexandra Schmitt, female   DOB: January 19, 1981, 41 y.o.   MRN: [id number] ? ? ? ?The patient's anterior cervical staples were removed without any complications or distress to the patient. The wound remained clean, dry, and well approximated.  ? ? ? ?Council Mechanic, DNP, NP-C ?03/22/2022 8:33 AM ?Neurosurgery ?

## 2022-03-22 NOTE — Progress Notes (Signed)
?                                                       PROGRESS NOTE ? ? ?Subjective/Complaints:  ? ?LBM 2 days ago- ?Pain pretty good except soreness.  ? ?Outing today to store.  ? ? ?ROS:  ? ?Pt denies SOB, abd pain, CP, N/V/C/D, and vision changes ? ?Objective: ?  ?No results found. ?No results for input(s): WBC, HGB, HCT, PLT in the last 72 hours. ? ?No results for input(s): NA, K, CL, CO2, GLUCOSE, BUN, CREATININE, CALCIUM in the last 72 hours. ? ? ?Intake/Output Summary (Last 24 hours) at 03/22/2022 0856 ?Last data filed at 03/22/2022 [id number] ?Gross per 24 hour  ?Intake 1200 ml  ?Output --  ?Net 1200 ml  ?  ? ?  ? ?Physical Exam: ?Vital Signs ?Blood pressure (!) 103/54, pulse 77, temperature 98.2 ?F (36.8 ?C), temperature source Oral, resp. rate 18, height 5\' 6"  (1.676 m), weight 108 kg, last menstrual period 03/07/2022, SpO2 97 %. ? ? ?Physical exam: ? ? ? ? ?General: awake, alert, appropriate,standing with OTA; working on ADLs; NAD ?HENT: conjugate gaze; oropharynx moist- wearing cervical collar ?CV: regular rate; no JVD ?Pulmonary: CTA B/L; no W/R/R- good air movement ?GI: soft, NT, ND, (+)BS ?Psychiatric: appropriate ?Neurological: Ox3 ? ?Skin: Cervical incision clean dry and intact. Dry dressing in place ?Neuro: Alert and oriented x4. Cognition intact.  CN II through XII grossly intact. Decreased sensation throughout all extremities.  Does say that she does have numbness but does feel some improvement in her ability to feel her right thigh.  Reports some numbness of her fingers on both right and left side, with left side more numb especially left ring finger and pinky. ?Musculoskeletal: 4/5 strength throughout.  ? ?Assessment/Plan: ?1. Functional deficits which require 3+ hours per day of interdisciplinary therapy in a comprehensive inpatient rehab setting. ?Physiatrist is providing close team supervision and 24 hour management of active medical problems listed below. ?Physiatrist and rehab team continue to  assess barriers to discharge/monitor patient progress toward functional and medical goals ? ?Care Tool: ? ?Bathing ?   ?Body parts bathed by patient: Right arm, Left arm, Chest, Abdomen, Front perineal area, Buttocks, Right upper leg, Left upper leg, Face, Right lower leg, Left lower leg  ? Body parts bathed by helper: Right lower leg, Left lower leg ?  ?  ?Bathing assist Assist Level: Supervision/Verbal cueing ?  ?  ?Upper Body Dressing/Undressing ?Upper body dressing   ?What is the patient wearing?: Bra, Pull over shirt ?   ?Upper body assist Assist Level: Set up assist ?   ?Lower Body Dressing/Undressing ?Lower body dressing ? ? ?   ?What is the patient wearing?: Pants ? ?  ? ?Lower body assist Assist for lower body dressing: Supervision/Verbal cueing ?   ? ?Toileting ?Toileting    ?Toileting assist Assist for toileting: Supervision/Verbal cueing ?  ?  ?Transfers ?Chair/bed transfer ? ?Transfers assist ?   ? ?Chair/bed transfer assist level: Contact Guard/Touching assist ?  ?  ?Locomotion ?Ambulation ? ? ?Ambulation assist ? ?   ? ?Assist level: Contact Guard/Touching assist ?Assistive device: Walker-rolling ?Max distance: 200'  ? ?Walk 10 feet activity ? ? ?Assist ?   ? ?Assist level: Contact Guard/Touching assist ?Assistive device: Walker-rolling  ? ?Walk  50 feet activity ? ? ?Assist   ? ?Assist level: Contact Guard/Touching assist ?Assistive device: Walker-rolling  ? ? ?Walk 150 feet activity ? ? ?Assist   ? ?Assist level: Contact Guard/Touching assist ?Assistive device: Walker-rolling ?  ? ?Walk 10 feet on uneven surface  ?activity ? ? ?Assist Walk 10 feet on uneven surfaces activity did not occur: Safety/medical concerns ? ? ?  ?   ? ?Wheelchair ? ? ? ? ?Assist Is the patient using a wheelchair?: Yes ?Type of Wheelchair: Manual ?  ? ?Wheelchair assist level: Supervision/Verbal cueing ?Max wheelchair distance: 150  ? ? ?Wheelchair 50 feet with 2 turns activity ? ? ? ?Assist ? ?  ?  ? ? ?Assist Level:  Supervision/Verbal cueing  ? ?Wheelchair 150 feet activity  ? ? ? ?Assist ?   ? ? ?Assist Level: Supervision/Verbal cueing  ? ?Blood pressure (!) 103/54, pulse 77, temperature 98.2 ?F (36.8 ?C), temperature source Oral, resp. rate 18, height 5\' 6"  (1.676 m), weight 108 kg, last menstrual period 03/07/2022, SpO2 97 %. ? ? ? ?Medical Problem List and Plan: ?1. 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            -patient may shower but incision must be covered ?            -ELOS/Goals: modI 10-14 days        ?           d/c 03/28/22 ? Continue CIR- PT, OT  ?2.  Antithrombotics: ?-DVT/anticoagulation:  Mechanical: Sequential compression devices, below knee Bilateral lower extremities ?5/8- OR was 6 days ago- will start Lovenox tomorrow ?5/9- started Lovenox 40 mg daily- been 7 days since surgery- went over risks/benefits of not using- pt agreed to Lovenox.  ?            -antiplatelet therapy: N/A ?3. Incisional pain: continue Oxycodone prn ?5/7 continue oxycodone as needed 5 to 10 mg every 4 hours for severe pain also may utilize Robaxin 500 mg tablet every 6 hours 5/11- pain controlled- just sore mainly- con't regimen ?4. Mood: LCSW to follow for evaluation and support.  ?            -antipsychotic agents: N/A ?5. Neuropsych: This patient is capable of making decisions on her own behalf. ?6. Skin/Wound Care: Routine pressure relief measures. ?5/8- change Honeycomb to dry dressing today and change daily.   ?7. Fluids/Electrolytes/Nutrition: Monitor I/O. Check CMET in am ?8. ADHD/Anxiety: In the past-->no meds for years. ?9.  Myalgias: add kpad, continue prn robaxin.  ?5/7 continue utilize Robaxin 500 mg tablet every 6 hours for muscle spasms ?10. Morbid obesity: BMI 38.41: provide dietary education ?5/7 continue dietary education as well as physical therapy and progressive exercise ?11. Burning thoracic pain; lumbar MRI imaging suggestive of thoracic spine  pedicle fractures: CT ordered to further assess. ?5/5 MRI findings are described as below: ?IMPRESSION: ?1. T1 hypointensity with associated T2/STIR hyperintensity in the ?bilateral T12 pedicles, right more than left, raise suspicion for ?acute to subacute fractures. Correlate with point tenderness, and ?consider CT to evaluate for a fracture plane. ?2. Prominent central protrusion at L4-L5 which narrows the ?subarticular zones and may irritate the traversing right L5 nerve ?root. ?3. Overall mild degenerative changes throughout the remainder of the ?lumbar spine as above without significant spinal canal or neural ?foraminal stenosis. ?5/6 CT today showed results that did not indicate fracture. ?Impression findings are as described  below: ?1. Partially visible postoperative soft tissue inflammation in the ?lower neck status post recent cervical ACDF. Minimal associated ?inflammation in the superior mediastinum. ?2. No acute osseous abnormality in the thoracic spine. 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 And degenerative appearing sclerosis of ?the bilateral T12 pedicles and costovertebral junctions. ?3. 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 ?-5/6 May continue to utilize pain strategies as outlined above. ? 5/8- no surgery required- went over CT independently and also went over results with pt.  ?12. Constipation: Senna 2 tabs HS ?-5/6 continue senna 2 tabs at bedtime continue mobility to promote bowel function.  May utilize as needed's if needed does have available MiraLAX 17 g as needed for mild constipation Dulcolax suppository 10 mg for moderate constipation and a fleets enema for severe constipation. ?-LBM charted as yesterday 03/16/2022. ?5/7 LBM today, pt  reports she feels relieved. ?  5/8- LBM yesterday- not constipated anymore ? 5/9- LBM-s mall BM yesterday- thinks she can go today- don't want her to get backed up again. ? 5/11- LBM 2 days ago but feels could go to

## 2022-03-22 NOTE — Progress Notes (Signed)
Occupational Therapy Session Note ? ?Patient Details  ?Name: Alexandra Schmitt ?MRN: [id number] ?Date of Birth: 11-Apr-1981 ? ?Today's Date: 03/22/2022 ?OT Individual Time: [id number] ?OT Individual Time Calculation (min): 90 min  ? ? ?Short Term Goals: ?Week 1:  OT Short Term Goal 1 (Week 1): Pt will improve R hand grip strength to 40# and L hand to 20# to improve functional use ?OT Short Term Goal 2 (Week 1): Pt will perform 3/3 toileting tasks with CGA ?OT Short Term Goal 3 (Week 1): Pt will perform LB ADL with CGA using AE PRN ?OT Short Term Goal 4 (Week 1): Pt will perform UB dress over collar with Set up ? ?Skilled Therapeutic Interventions/Progress Updates:  ?  Academic librarian with focus on functional amb in community envirionment, accessing public restrooms, navigating hazards in community setting, energy conservation strategies/techniques, aactivity tolerance, and safety awareness. Pt navigated in/out Rehab van using setps with CGA. Amb in parking lot and inside coffee shop with supervision. Pt access public rest room with assistance to operate door. Pt able to access items on shelving at varying heights with supervision. Continued with discharge planning. Pt returned to room and sat EOB. Pt remained in bed with all needs within reach and bed alarm activated.  ? ?Therapy Documentation ?Precautions:  ?Precautions ?Precautions: Cervical ?Precaution Booklet Issued: No ?Precaution Comments: verbally reviewed cervical precautions with pt ?Required Braces or Orthoses: Cervical Brace ?Cervical Brace: Hard collar, At all times ?Restrictions ?Weight Bearing Restrictions: No ? ?Pain: ? Pt denies pain ? ? ?Therapy/Group: Individual Therapy ? ?Leroy Libman ?03/22/2022, 2:46 PM ?

## 2022-03-23 DIAGNOSIS — G629 Polyneuropathy, unspecified: Secondary | ICD-10-CM

## 2022-03-23 DIAGNOSIS — R260 Ataxic gait: Secondary | ICD-10-CM

## 2022-03-23 DIAGNOSIS — R209 Unspecified disturbances of skin sensation: Secondary | ICD-10-CM

## 2022-03-23 NOTE — Progress Notes (Signed)
Physical Therapy Session Note ? ?Patient Details  ?Name: Alexandra Schmitt ?MRN: [id number] ?Date of Birth: 1981-07-01 ? ?Today's Date: 03/23/2022 ?PT Individual Time: 0940-1005 ?PT Individual Time Calculation (min): 25 min  ? ?Short Term Goals: ?Week 1:  PT Short Term Goal 1 (Week 1): pt will perform stairs x 12 with 1 hand rail and CGA ?PT Short Term Goal 2 (Week 1): Pt will ambulate with supervision >150 ft ?PT Short Term Goal 3 (Week 1): Pt will improve TUG score by MCID of 5.7 sec ?Week 2:    ? ?Skilled Therapeutic Interventions/Progress Updates:  ?Pt received supine in bed and agreeable to PT. Supine>sit transfer without assist or cues. Pt performed all sit<>stand transfer with supervision assist for safety and intermittent UE support on RW. ? ?Gait training with RW 2 x 169f with cues for safety in turns. Mild ataxia on the on RLE>LLE.  ? ?Dynamic balance training to perform lateral reach and toss bean bag to target x 5 BUE standing on level surface and x 5 BUE standing on airex pad. Pt and  required CGA on level surface and min assist on airex pad.  ?Pt utilizes reacher to pick up all 20 bean bags with intermittent R and L UE control of reacher as well as intermittent 1 UE support on RW.  ? ?Patient returned to room and performed stand pivot to recliner with RW and supervision assist for safety. Pt left sitting in recliner with call bell in reach and all needs met.   ? ? ?   ? ?Therapy Documentation ?Precautions:  ?Precautions ?Precautions: Cervical ?Precaution Booklet Issued: No ?Precaution Comments: verbally reviewed cervical precautions with pt ?Required Braces or Orthoses: Cervical Brace ?Cervical Brace: Hard collar, At all times ?Restrictions ?Weight Bearing Restrictions: No ? ?  ?Pain: ?Pain Assessment ?Pain Scale: 0-10 ?Pain Score: 3  ?Pain Type: Acute pain ?Pain Location: Neck ?Pain Intervention(s): Repositioned ?Therapy/Group: Individual Therapy ? ?ALorie Phenix?03/23/2022, 10:06 AM  ?

## 2022-03-23 NOTE — Plan of Care (Signed)
?  Problem: Consults ?Goal: RH GENERAL PATIENT EDUCATION ?Description: See Patient Education module for education specifics. ?Outcome: Progressing ?Goal: Skin Care Protocol Initiated - if Braden Score 18 or less ?Description: If consults are not indicated, leave blank or document N/A ?Outcome: Progressing ?  ?Problem: RH BOWEL ELIMINATION ?Goal: RH STG MANAGE BOWEL WITH ASSISTANCE ?Description: STG Manage Bowel with Supervision Assistance. ?Outcome: Progressing ?Goal: RH STG MANAGE BOWEL W/MEDICATION W/ASSISTANCE ?Description: STG Manage Bowel with Medication with Supervision Assistance. ?Outcome: Progressing ?  ?Problem: RH SKIN INTEGRITY ?Goal: RH STG MAINTAIN SKIN INTEGRITY WITH ASSISTANCE ?Description: STG Maintain Skin Integrity With Supervision Assistance. ?Outcome: Progressing ?Goal: RH STG ABLE TO PERFORM INCISION/WOUND CARE W/ASSISTANCE ?Description: STG Able To Perform Incision/Wound Care With Supervision Assistance. ?Outcome: Progressing ?  ?Problem: RH SAFETY ?Goal: RH STG ADHERE TO SAFETY PRECAUTIONS W/ASSISTANCE/DEVICE ?Description: STG Adhere to Safety Precautions With Cues and Reminders. ?Outcome: Progressing ?Goal: RH STG DECREASED RISK OF FALL WITH ASSISTANCE ?Description: STG Decreased Risk of Fall With Supervision Assistance. ?Outcome: Progressing ?  ?Problem: RH PAIN MANAGEMENT ?Goal: RH STG PAIN MANAGED AT OR BELOW PT'S PAIN GOAL ?Description: < 3 on a 0-10 pain scale. ?Outcome: Progressing ?  ?Problem: RH KNOWLEDGE DEFICIT GENERAL ?Goal: RH STG INCREASE KNOWLEDGE OF SELF CARE AFTER HOSPITALIZATION ?Description: Patient will demonstrate knowledge of self-care management, medication/pain management, skin/wound care with educational materials and handouts provided by staff independently at discharge. ?Outcome: Progressing ?  ?

## 2022-03-23 NOTE — Progress Notes (Signed)
?                                                       PROGRESS NOTE ? ? ?Subjective/Complaints:  ? ?Pt reports sore due ot therapy.  ?LBM yesterday.  ? ?ROS:  ? ?Pt denies SOB, abd pain, CP, N/V/C/D, and vision changes ? ?Objective: ?  ?No results found. ?No results for input(s): WBC, HGB, HCT, PLT in the last 72 hours. ? ?No results for input(s): NA, K, CL, CO2, GLUCOSE, BUN, CREATININE, CALCIUM in the last 72 hours. ? ? ?Intake/Output Summary (Last 24 hours) at 03/23/2022 0927 ?Last data filed at 03/23/2022 [id number] ?Gross per 24 hour  ?Intake 478 ml  ?Output --  ?Net 478 ml  ?  ? ?  ? ?Physical Exam: ?Vital Signs ?Blood pressure 113/63, pulse 76, temperature 98.4 ?F (36.9 ?C), temperature source Oral, resp. rate 18, height 5\' 6"  (1.676 m), weight 108 kg, last menstrual period 03/07/2022, SpO2 98 %. ? ? ? ?General: awake, alert, appropriate, sitting up in bed; NAD ?HENT: conjugate gaze; oropharynx moist- staples out of neck- cervical incision in place ?CV: regular rate; no JVD ?Pulmonary: CTA B/L; no W/R/R- good air movement ?GI: soft, NT, ND, (+)BS ?Psychiatric: appropriate ?Neurological: Ox3 ? ? ?Skin: Cervical incision clean dry and intact. Dry dressing in place ?Neuro: Alert and oriented x4. Cognition intact.  CN II through XII grossly intact. Decreased sensation throughout all extremities.  Does say that she does have numbness but does feel some improvement in her ability to feel her right thigh.  Reports some numbness of her fingers on both right and left side, with left side more numb especially left ring finger and pinky. ?Musculoskeletal: 4/5 strength throughout.  ? ?Assessment/Plan: ?1. Functional deficits which require 3+ hours per day of interdisciplinary therapy in a comprehensive inpatient rehab setting. ?Physiatrist is providing close team supervision and 24 hour management of active medical problems listed below. ?Physiatrist and rehab team continue to assess barriers to discharge/monitor patient  progress toward functional and medical goals ? ?Care Tool: ? ?Bathing ?   ?Body parts bathed by patient: Right arm, Left arm, Chest, Abdomen, Front perineal area, Buttocks, Right upper leg, Left upper leg, Face, Right lower leg, Left lower leg  ? Body parts bathed by helper: Right lower leg, Left lower leg ?  ?  ?Bathing assist Assist Level: Supervision/Verbal cueing ?  ?  ?Upper Body Dressing/Undressing ?Upper body dressing   ?What is the patient wearing?: Bra, Pull over shirt ?   ?Upper body assist Assist Level: Set up assist ?   ?Lower Body Dressing/Undressing ?Lower body dressing ? ? ?   ?What is the patient wearing?: Pants ? ?  ? ?Lower body assist Assist for lower body dressing: Supervision/Verbal cueing ?   ? ?Toileting ?Toileting    ?Toileting assist Assist for toileting: Supervision/Verbal cueing ?  ?  ?Transfers ?Chair/bed transfer ? ?Transfers assist ?   ? ?Chair/bed transfer assist level: Supervision/Verbal cueing ?  ?  ?Locomotion ?Ambulation ? ? ?Ambulation assist ? ?   ? ?Assist level: Supervision/Verbal cueing ?Assistive device: Walker-rolling ?Max distance: 400'  ? ?Walk 10 feet activity ? ? ?Assist ?   ? ?Assist level: Supervision/Verbal cueing ?Assistive device: Walker-rolling  ? ?Walk 50 feet activity ? ? ?Assist   ? ?Assist level:  Supervision/Verbal cueing ?Assistive device: Walker-rolling  ? ? ?Walk 150 feet activity ? ? ?Assist   ? ?Assist level: Supervision/Verbal cueing ?Assistive device: Walker-rolling ?  ? ?Walk 10 feet on uneven surface  ?activity ? ? ?Assist Walk 10 feet on uneven surfaces activity did not occur: Safety/medical concerns ? ? ?Assist level: Contact Guard/Touching assist ?Assistive device: Walker-rolling  ? ?Wheelchair ? ? ? ? ?Assist Is the patient using a wheelchair?: Yes ?Type of Wheelchair: Manual ?  ? ?Wheelchair assist level: Supervision/Verbal cueing ?Max wheelchair distance: 150  ? ? ?Wheelchair 50 feet with 2 turns activity ? ? ? ?Assist ? ?  ?  ? ? ?Assist Level:  Supervision/Verbal cueing  ? ?Wheelchair 150 feet activity  ? ? ? ?Assist ?   ? ? ?Assist Level: Supervision/Verbal cueing  ? ?Blood pressure 113/63, pulse 76, temperature 98.4 ?F (36.9 ?C), temperature source Oral, resp. rate 18, height 5\' 6"  (1.676 m), weight 108 kg, last menstrual period 03/07/2022, SpO2 98 %. ? ? ? ?Medical Problem List and Plan: ?1. 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            -patient may shower but incision must be covered ?            -ELOS/Goals: modI 10-14 days        ?           d/c 03/28/22 ? Continue CIR- PT, OT - we discussed will need f/u with me after d/c- also will need pain meds at d/c and d/c 9-11am ?2.  Antithrombotics: ?-DVT/anticoagulation:  Mechanical: Sequential compression devices, below knee Bilateral lower extremities ?5/8- OR was 6 days ago- will start Lovenox tomorrow ?5/9- started Lovenox 40 mg daily- been 7 days since surgery- went over risks/benefits of not using- pt agreed to Lovenox.  ?            -antiplatelet therapy: N/A ?3. Incisional pain: continue Oxycodone prn ?5/7 continue oxycodone as needed 5 to 10 mg every 4 hours for severe pain also may utilize Robaxin 500 mg tablet every 6 hours 5/11- pain controlled- just sore mainly- con't regimen ?4. Mood: LCSW to follow for evaluation and support.  ?            -antipsychotic agents: N/A ?5. Neuropsych: This patient is capable of making decisions on her own behalf. ?6. Skin/Wound Care: Routine pressure relief measures. ?5/8- change Honeycomb to dry dressing today and change daily.   ?7. Fluids/Electrolytes/Nutrition: Monitor I/O. Check CMET in am ?8. ADHD/Anxiety: In the past-->no meds for years. ?9.  Myalgias: add kpad, continue prn robaxin.  ?5/7 continue utilize Robaxin 500 mg tablet every 6 hours for muscle spasms ?10. Morbid obesity: BMI 38.41: provide dietary education ?5/7 continue dietary education as well as physical therapy and  progressive exercise ?11. Burning thoracic pain; lumbar MRI imaging suggestive of thoracic spine pedicle fractures: CT ordered to further assess. ?5/5 MRI findings are described as below: ?IMPRESSION: ?1. T1 hypointensity with associated T2/STIR hyperintensity in the ?bilateral T12 pedicles, right more than left, raise suspicion for ?acute to subacute fractures. Correlate with point tenderness, and ?consider CT to evaluate for a fracture plane. ?2. Prominent central protrusion at L4-L5 which narrows the ?subarticular zones and may irritate the traversing right L5 nerve ?root. ?3. Overall mild degenerative changes throughout the remainder of the ?lumbar spine as above without significant spinal canal or neural ?foraminal stenosis. ?5/6 CT today showed results that  did not indicate fracture. ?Impression findings are as described below: ?1. Partially visible postoperative soft tissue inflammation in the ?lower neck status post recent cervical ACDF. Minimal associated ?inflammation in the superior mediastinum. ?2. No acute osseous abnormality in the thoracic spine. 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 And degenerative appearing sclerosis of ?the bilateral T12 pedicles and costovertebral junctions. ?3. 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 ?-5/6 May continue to utilize pain strategies as outlined above. ? 5/8- no surgery required- went over CT independently and also went over results with pt.  ?12. Constipation: Senna 2 tabs HS ?-5/6 continue senna 2 tabs at bedtime continue mobility to promote bowel function.  May utilize as needed's if needed does have available MiraLAX 17 g as needed for mild constipation Dulcolax suppository 10 mg for moderate constipation and a  fleets enema for severe constipation. ?-LBM charted as yesterday 03/16/2022. ?5/7 LBM today, pt reports she feels relieved. ?  5/8- LBM yesterday- not constipated anymore ? 5/9- LBM-s mall BM yesterday- thinks she can

## 2022-03-23 NOTE — Progress Notes (Signed)
Physical Therapy Session Note ? ?Patient Details  ?Name: Alexandra Schmitt ?MRN: [id number] ?Date of Birth: 06-May-1981 ? ?Today's Date: 03/23/2022 ?PT Individual Time: 4967-5916 and 3846-6599 ?PT Individual Time Calculation (min): 71 min and 72 min ? ?Short Term Goals: ?Week 1:  PT Short Term Goal 1 (Week 1): pt will perform stairs x 12 with 1 hand rail and CGA ?PT Short Term Goal 2 (Week 1): Pt will ambulate with supervision >150 ft ?PT Short Term Goal 3 (Week 1): Pt will improve TUG score by MCID of 5.7 sec ? ?Skilled Therapeutic Interventions/Progress Updates: Tx1: Pt presented in recliner agreeable to therapy. Pt denies pain but indicates some ms soreness at upper traps, mid thoracic and lower back/buttock area. Lattie Haw, RT present during session. Pt ambulated to rehab gym with CGA fading to close supervision with RW. Pt noted to ambulate and increased speed but no LOB nor decreased safety noted. Session focused on dynamic balance without AD. Pt set up in [facility] for safety. Pt participated in activities including ball toss with increasing reaching outside BOS, forward and lateral stepping to target. Forward leans with green theraband at hips to increase stepping strategy. Pt also participated in toss and catch with 65cm physioball, and ball taps while standing on [employer] using 2lb dowel. Pt would take intermittent seated breaks to allow for recovery with RT continuing education regarding safety at home environment and energy conservation. Pt ambulated back to room at end of session with RW and close S. Pt left in recliner at end of session with belt alarm on, call bell within reach and needs met.  ? ?Tx2: Pt presented in recliner agreeable to therapy. Pt denies pain during session. Session focused on gait, balance, and endurance. Pt ambulated to ortho gym >250f without AD and CGA. PTA noting with fatigue decreased R foot clearance however pt aware and no LOB noted. In ortho gym PTA obtained and educated pt on use of  theracane. Pt was able to trial and expressed able to palpate trigger points and obtain relief. Pt then ambulated to day room CGA and remaining session focused on dynamic balance activities. Pt participated in [place] including tilt board, p[employer] and [employer] Pt noted to require increased effort to perform lateral leans to R and initally required instruction in performing weight shifting vs leading with shoulders/hips. Pt also participated in [place] on 4 in step then changing to single LE on [place]. Pt able to [facility] noted. Pt ambulated back to room and used bathroom with distance supervision. Pt returned to recliner at end of session and left with belt alarm on, call bell within reach and needs met.  ?   ? ?Therapy Documentation ?Precautions:  ?Precautions ?Precautions: Cervical ?Precaution Booklet Issued: No ?Precaution Comments: verbally reviewed cervical precautions with pt ?Required Braces or Orthoses: Cervical Brace ?Cervical Brace: Hard collar, At all times ?Restrictions ?Weight Bearing Restrictions: No ?General: ?  ?Vital Signs: ?  ?Pain: ?  ?Mobility: ?  ?Locomotion : ?   ?Trunk/Postural Assessment : ?   ?Balance: ?  ?Exercises: ?  ?Other Treatments:   ? ? ? ?Therapy/Group: Individual Therapy ? ?Satara Virella ?03/23/2022, 1:01 PM  ?

## 2022-03-23 NOTE — Progress Notes (Signed)
Occupational Therapy Session Note ? ?Patient Details  ?Name: Alexandra Schmitt ?MRN: [id number] ?Date of Birth: 05-12-1981 ? ?Today's Date: 03/23/2022 ?OT Individual Time: 0865-7846 ?OT Individual Time Calculation (min): 44 min  ? ? ?Short Term Goals: ?Week 1:  OT Short Term Goal 1 (Week 1): Pt will improve R hand grip strength to 40# and L hand to 20# to improve functional use ?OT Short Term Goal 2 (Week 1): Pt will perform 3/3 toileting tasks with CGA ?OT Short Term Goal 3 (Week 1): Pt will perform LB ADL with CGA using AE PRN ?OT Short Term Goal 4 (Week 1): Pt will perform UB dress over collar with Set up ? ?Skilled Therapeutic Interventions/Progress Updates:  ?  Completed all functional mobility with no AD and CGA. Pt with 4/10 neck pain declining intervention stating, "its good." Pt requesting to focus on BUE dexterity and coodination after grooming standing at sink with VC for not stabilizing hips on counter. Pt completes palm<>finger translation, stacking and placing coins in piggy bank with min cuing for no compensatory movements. Pt completes finger flexion to wad tissue and printer paper up onto balls and toss into trashcan. Ot and pt teach each other oragami figures for BUE dexterity- paper airplane and heart. Exited session with pt seated in bed, exit alarm on and call light in reach ? ? ?Therapy Documentation ?Precautions:  ?Precautions ?Precautions: Cervical ?Precaution Booklet Issued: No ?Precaution Comments: verbally reviewed cervical precautions with pt ?Required Braces or Orthoses: Cervical Brace ?Cervical Brace: Hard collar, At all times ?Restrictions ?Weight Bearing Restrictions: No ?General: ?  ?Vital Signs: ?  ?Pain: ?  ?ADL: ?ADL ?Eating: Set up ?Grooming: Minimal assistance ?Upper Body Bathing: Supervision/safety ?Where Assessed-Upper Body Bathing: Standing at sink, Sitting at sink ?Lower Body Bathing: Moderate assistance ?Where Assessed-Lower Body Bathing: Standing at sink, Sitting at  sink ?Upper Body Dressing: Minimal assistance ?Lower Body Dressing: Minimal assistance ?Toileting: Minimal assistance ?Toilet Transfer: Contact guard ?Toilet Transfer Method: Ambulating ?Vision ?  ?Perception  ?  ?Praxis ?  ?Balance ?  ?Exercises: ?  ?Other Treatments:   ? ? ?Therapy/Group: Individual Therapy ? ?[name] ?03/23/2022, 1:43 PM ?

## 2022-03-23 NOTE — Progress Notes (Signed)
Recreational Therapy Session Note ? ?Patient Details  ?Name: Alexandra Schmitt ?MRN: [id number] ?Date of Birth: [birth year] ?Today's Date: 03/23/2022 ? ?Pain: no c/o ?Skilled Therapeutic Interventions/Progress Updates: Session focused on activity tolerance, dynamic standing balance, safety awareness, UE use during cotreat with PT.  Pt ambulated to and from the therapy gym with RW and supervision.  In the gym pt performed moderately complex balance activities without UE support in the [facility] for safety.   ? ?Therapy/Group: Co-Treatment ? ? ?Siaosi Alter ?03/23/2022, 12:30 PM  ?

## 2022-03-24 MED ORDER — HYDROCORTISONE 1 % EX CREA
TOPICAL_CREAM | Freq: Three times a day (TID) | CUTANEOUS | Status: DC | PRN
  Filled 2022-03-24: qty 28

## 2022-03-24 MED ORDER — WITCH HAZEL-GLYCERIN EX PADS
MEDICATED_PAD | CUTANEOUS | Status: DC | PRN
  Filled 2022-03-24: qty 100

## 2022-03-24 NOTE — Evaluation (Signed)
Speech Language Pathology Assessment and Plan ? ?Patient Details  ?Name: Alexandra Schmitt ?MRN: [id number] ?Date of Birth: October 01, 1981 ? ?SLP Diagnosis: N/A ?Rehab Potential: N/A ?ELOS: N/A ? ? ?Today's Date: 03/24/2022 ?SLP Individual Time: 6256-3893 ?SLP Individual Time Calculation (min): 10 min ? ? ?Hospital Problem: Principal Problem: ?  Acute incomplete quadriplegia ([place]) ?Active Problems: ?  Obesity ?  Cervical myelopathy ([place]) ?  Ataxic gait ?  Neuropathy ?  Sensory disorder--BLE/BUE ? ?Past Medical History:  ?Past Medical History:  ?Diagnosis Date  ? ADHD (attention deficit hyperactivity disorder)   ? Anxiety   ? ?Past Surgical History:  ?Past Surgical History:  ?Procedure Laterality Date  ? ANTERIOR CERVICAL DECOMP/DISCECTOMY FUSION N/A 03/13/2022  ? Procedure: Cervical four-five, Cervical five-six Anterior Cervical Decompression Discectomy Fusion;  Surgeon: Dawley, Theodoro Doing, DO;  Location: [facility];  Service: Neurosurgery;  Laterality: N/A;  ? BROKEN NOSE    ? RIGHT FOOT RECONSTRUCTION    ? TONSILLECTOMY    ? ? ?Assessment / Plan / Recommendation ?Clinical Impression Patient currently on Dys. 3 textures after an ACDF with request to upgrade to regular textures. She also reports she is able to take multiple pills whole with thin liquids without difficulty. Patient's oral-motor exam, vocal intensity, and vocal quality are [employer]. Patient consumed a snack of regular textures (bag of chips) with thin liquids via straw. Patient demonstrated efficient mastication and had what appeared to be a swift swallow response without overt s/s of aspiration. Patient also did not demonstrate or report any signs of possible pharyngeal residuals like multiple swallows or a globus sensation. Suspect patient's swallow function is back to baseline, therefore, recommend patient upgrade to regular textures with f/u not warranted at this time. Patient educated regarding universal aspiration precautions and verbalized understanding.  ?  ?Skilled  Therapeutic Interventions          Administered a BSE, please see above for details   ?SLP Assessment ? Patient does not need any further Speech Lanaguage Pathology Services  ?  ?Recommendations ? SLP Diet Recommendations: Age appropriate regular solids;Thin ?Liquid Administration via: Straw ?Medication Administration: Whole meds with liquid ?Postural Changes and/or Swallow Maneuvers: Seated upright 90 degrees ?Oral Care Recommendations: Oral care BID ?Patient destination: Home ?Follow up Recommendations: None ?Equipment Recommended: None recommended by SLP  ?  ?SLP Frequency N/A  ?SLP Duration ? ?SLP Intensity ? ?SLP Treatment/Interventions N/A ? ?N/A ? ? N/A  ? ?Pain ? No/Denies Pain  ? ?Oral Motor ?Oral Motor/Sensory Function ?Overall Oral Motor/Sensory Function: Within functional limits (Hard-collar in place but funcitonal mandibular ROM) ?Motor Speech ?Overall Motor Speech: Appears within functional limits for tasks assessed ? ?Care Tool ?Care Tool Cognition ?Ability to hear (with hearing aid or hearing appliances if normally used Ability to hear (with hearing aid or hearing appliances if normally used): 0. Adequate - no difficulty in normal conservation, social interaction, listening to TV ?  ?Expression of Ideas and Wants Expression of Ideas and Wants: 4. Without difficulty (complex and basic) - expresses complex messages without difficulty and with speech that is clear and easy to understand ?  ?Understanding Verbal and Non-Verbal Content Understanding Verbal and Non-Verbal Content: 4. Understands (complex and basic) - clear comprehension without cues or repetitions  ?Memory/Recall Ability    ? ?Bedside Swallowing Assessment ?General ?Date of Onset: 03/24/22 ?Previous Swallow Assessment: N/A ?Diet Prior to this Study: Dysphagia 3 (soft);Thin liquids ?Temperature Spikes Noted: No ?Respiratory Status: Room air ?History of Recent Intubation: No ?Behavior/Cognition: Alert;Cooperative;Pleasant mood ?Oral  Cavity -  Dentition: Adequate natural dentition ?Self-Feeding Abilities: Able to feed self ?Patient Positioning: Upright in bed ?Baseline Vocal Quality: Normal ?Volitional Cough: Strong ?Volitional Swallow: Able to elicit  ?Ice Chips ?Ice chips: Not tested ?Thin Liquid ?Thin Liquid: Within functional limits ?Presentation: Straw;Self Fed ?Nectar Thick ?Nectar Thick Liquid: Not tested ?Honey Thick ?Honey Thick Liquid: Not tested ?Puree ?Puree: Not tested ?Solid ?Solid: Within functional limits ?BSE Assessment ?Risk for Aspiration ?Impact on safety and function: No limitations ? ?Short Term Goals: N/A ? ? ?Refer to Care Plan for Long Term Goals ? ?Recommendations for other services: None  ? ?Discharge Criteria: Patient will be discharged from SLP if patient refuses treatment 3 consecutive times without medical reason, if treatment goals not met, if there is a change in medical status, if patient makes no progress towards goals or if patient is discharged from hospital. ? ?The above assessment, treatment plan, treatment alternatives and goals were discussed and mutually agreed upon: by patient ? ?Alexandra Schmitt ?03/24/2022, 2:40 PM ? ? ?

## 2022-03-24 NOTE — Progress Notes (Signed)
PROGRESS NOTE   Subjective/Complaints: Patient seen sitting in chair today.  She says she is doing much better than before and is actually able to get off almost independently now.  Says that the numbness of her hands and fingers has improved but she does have some residual numbness of her right ring and pinky finger of her left hand.  She has improved pinpoint sensation versus where she was a week ago.  ROS: Negative for chest pain SOB nausea vomiting constipation diarrhea positive for issues with hemorrhoids and mild constipation.  Sensory changes and weakness which have improving. #12 slow Objective:   No results found. No results for input(s): WBC, HGB, HCT, PLT in the last 72 hours. No results for input(s): NA, K, CL, CO2, GLUCOSE, BUN, CREATININE, CALCIUM in the last 72 hours.  Intake/Output Summary (Last 24 hours) at 03/24/2022 1253 Last data filed at 03/24/2022 0852 Gross per 24 hour  Intake 120 ml  Output --  Net 120 ml        Physical Exam: Vital Signs Blood pressure (!) 101/51, pulse 75, temperature 98.6 F (37 C), temperature source Oral, resp. rate 18, height 5\' 6"  (1.676 m), weight 108 kg, last menstrual period 03/07/2022, SpO2 99 %.   Physical exam:  General: Alert and oriented x 3, No apparent distress.  BMI 38.41 HEENT: Head is normocephalic, atraumatic, PERRLA, EOMI, sclera anicteric, oral mucosa pink and moist, dentition intact, ext ear canals clear,  Neck: Supple without JVD or lymphadenopathy Heart: Reg rate and rhythm. No murmurs rubs or gallops Chest: CTA bilaterally without wheezes, rales, or rhonchi; no distress Abdomen: Soft, non-tender, non-distended, bowel sounds positive. Extremities: No clubbing, cyanosis, or edema. Pulses are 2+ Psych: Pt's affect is appropriate. Pt is cooperative Skin: Cervical incision clean dry and intact. Neuro: Alert and oriented x4. Cognition intact.  CN II through XII  grossly intact. Decreased sensation throughout all extremities has improved over the past week.  Does say that she does have numbness but does feel some improvement. Reports some numbness of her fingers on both right and left side, with left side more numb especially left ring finger and pinky. Musculoskeletal: 4/5 strength throughout.   Assessment/Plan: 1. Functional deficits which require 3+ hours per day of interdisciplinary therapy in a comprehensive inpatient rehab setting. Physiatrist is providing close team supervision and 24 hour management of active medical problems listed below. Physiatrist and rehab team continue to assess barriers to discharge/monitor patient progress toward functional and medical goals  Care Tool:  Bathing    Body parts bathed by patient: Right arm, Left arm, Chest, Abdomen, Front perineal area, Buttocks, Right upper leg, Left upper leg, Face, Right lower leg, Left lower leg   Body parts bathed by helper: Right lower leg, Left lower leg     Bathing assist Assist Level: Supervision/Verbal cueing     Upper Body Dressing/Undressing Upper body dressing   What is the patient wearing?: Bra, Pull over shirt    Upper body assist Assist Level: Set up assist    Lower Body Dressing/Undressing Lower body dressing      What is the patient wearing?: Pants     Lower body assist  Assist for lower body dressing: Supervision/Verbal cueing     Toileting Toileting    Toileting assist Assist for toileting: Supervision/Verbal cueing     Transfers Chair/bed transfer  Transfers assist     Chair/bed transfer assist level: Supervision/Verbal cueing     Locomotion Ambulation   Ambulation assist      Assist level: Supervision/Verbal cueing Assistive device: Walker-rolling Max distance: 400'   Walk 10 feet activity   Assist     Assist level: Supervision/Verbal cueing Assistive device: Walker-rolling   Walk 50 feet activity   Assist     Assist level: Supervision/Verbal cueing Assistive device: Walker-rolling    Walk 150 feet activity   Assist    Assist level: Supervision/Verbal cueing Assistive device: Walker-rolling    Walk 10 feet on uneven surface  activity   Assist Walk 10 feet on uneven surfaces activity did not occur: Safety/medical concerns   Assist level: Contact Guard/Touching assist Assistive device: Walker-rolling   Wheelchair     Assist Is the patient using a wheelchair?: Yes Type of Wheelchair: Manual    Wheelchair assist level: Supervision/Verbal cueing Max wheelchair distance: 150    Wheelchair 50 feet with 2 turns activity    Assist        Assist Level: Supervision/Verbal cueing   Wheelchair 150 feet activity     Assist      Assist Level: Supervision/Verbal cueing   Blood pressure (!) 101/51, pulse 75, temperature 98.6 F (37 C), temperature source Oral, resp. rate 18, height  (1.676 m), weight 108 kg, last menstrual period 03/07/2022, SpO2 99 %.    Medical Problem List and Plan: 1. 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  Antithrombotics: -DVT/anticoagulation:  Mechanical: Sequential compression devices, below knee Bilateral lower extremities             -antiplatelet therapy: N/A 5/8- OR was 6 days ago- will start Lovenox tomorrow 5/9- started Lovenox 40 mg daily- been 7 days since surgery- went over risks/benefits of not using- pt agreed to Lovenox.  3. Incisional pain: continue Oxycodone prn 5/7 continue oxycodone as needed 5 to 10 mg every 4 hours for severe pain also may utilize Robaxin 500 mg tablet every 6 hours for muscle spasms 5/13 patient reports no issues  with pain today. 4. Mood: LCSW to follow for evaluation and support.              -antipsychotic agents: N/A 5. Neuropsych: This patient is capable of making decisions on her own behalf. 6. Skin/Wound Care: Routine pressure relief measures.  7. Fluids/Electrolytes/Nutrition: Monitor I/O. Check CMET in am 8. ADHD/Anxiety: In the past-->no meds for years. 9.  Myalgias: add kpad, continue prn robaxin.  5/7 continue utilize Robaxin 500 mg tablet every 6 hours for muscle spasms 10. Morbid obesity: BMI 38.41: provide dietary education 5/7 continue dietary education as well as physical therapy and progressive exercise 11. Burning thoracic pain; lumbar MRI imaging suggestive of thoracic spine pedicle fractures: CT ordered to  further assess. 5/5 MRI findings are described as below: IMPRESSION: 1. T1 hypointensity with associated T2/STIR hyperintensity in the bilateral T12 pedicles, right more than left, raise suspicion for acute to subacute fractures. Correlate with point tenderness, and consider CT to evaluate for a fracture plane. 2. Prominent central protrusion at L4-L5 which narrows the subarticular zones and may irritate the traversing right L5 nerve root. 3. Overall mild degenerative changes throughout the remainder of the lumbar spine as above without significant spinal canal or neural foraminal stenosis. 5/6 CT today showed results that did not indicate fracture. Impression findings are as described below: 1. Partially visible postoperative soft tissue inflammation in the lower neck status post recent cervical ACDF. Minimal associated inflammation in the superior mediastinum. 2. No acute osseous abnormality in the thoracic spine. Congenital T9 and T12 butterfly vertebrae. And degenerative appearing sclerosis of the bilateral T12 pedicles and costovertebral junctions. 3. 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 -5/6 May continue to utilize pain strategies as outlined above. 12. Constipation: Senna 2 tabs HS -5/6 continue senna 2 tabs at bedtime continue mobility to promote bowel function.  May utilize as needed's if needed does have available MiraLAX 17 g as needed for mild constipation Dulcolax suppository 10 mg for moderate constipation and a fleets enema for severe constipation. -LBM charted as yesterday 03/16/2022. 5/7 LBM today, pt reports she feels relieved.  5/8- LBM yesterday- not constipated anymore             5/9- LBM-s mall BM yesterday- thinks she can go today- don't want her to get backed up again.             5/11- LBM 2 days ago but feels could go today.              5/12- LBM yesterday- medium            5/13 No BM today, pt reports issues with external hemorrhoids with LBM 13.  Hemorrhoids external, chronic 5/13 No BM today, pt reports issues with external hemorrhoids with LBM 5/13 placed order today for Preparation H cream and Tucks pads for symptomatic relief.  If persistent may see GI at discharge as an outpatient. 14.  Advance diet 5/13 patient is requesting to advance diet from dysphagia 3 to regular diet per recommendations by Dr. Benjie Karvonen we will schedule SLP consult to ensure that it is safe to advance.  Will update diet after SLP sees patient.   LOS: 8 days A FACE TO FACE EVALUATION WAS PERFORMED  Tressia Miners 03/24/2022, 12:53 PM

## 2022-03-25 NOTE — Progress Notes (Signed)
Physical Therapy Weekly Progress Note ? ?Patient Details  ?Name: Alexandra Schmitt ?MRN: [id number] ?Date of Birth: 1980/12/16 ? ?Beginning of progress report period: Mar 17, 2022 ?End of progress report period: Mar 25, 2022 ? ?Today's Date: 03/25/2022 ?PT Individual Time: 2585-2778 ?PT Individual Time Calculation (min): 45 min  ? ?Patient has met 2 of 3 short term goals.  Pt is making great progress towards therapy goals. She is currently Supervision for bed mobility, Supervision for sit to stand and transfers with and without AD, Supervision for gait up to 400 ft with RW and 150 ft CGA with no AD, CGA for stair navigation with one handrail per home setup, and has even been able to perform a floor transfer this week with CGA for safety. Pt has also been able to complete an outing into the community with OT and recreational therapy to gain better insight and practice into challenges she will face upon d/c. Pt remains very motivated and exhibits great participation in therapy sessions. She did not meet goal of improving TUG score as TUG was not reassessed during this reporting period. This will be reassessed prior to d/c home. ? ?Patient continues to demonstrate the following deficits muscle weakness and muscle joint tightness, decreased cardiorespiratoy endurance, unbalanced muscle activation and decreased coordination, and decreased standing balance, decreased postural control, and decreased balance strategies and therefore will continue to benefit from skilled PT intervention to increase functional independence with mobility. ? ?Patient progressing toward long term goals..  Continue plan of care. ? ?PT Short Term Goals ?Week 1:  PT Short Term Goal 1 (Week 1): pt will perform stairs x 12 with 1 hand rail and CGA ?PT Short Term Goal 1 - Progress (Week 1): Met ?PT Short Term Goal 2 (Week 1): Pt will ambulate with supervision >150 ft ?PT Short Term Goal 2 - Progress (Week 1): Met ?PT Short Term Goal 3 (Week 1): Pt will  improve TUG score by MCID of 5.7 sec ?PT Short Term Goal 3 - Progress (Week 1): Progressing toward goal ?Week 2:  PT Short Term Goal 1 (Week 2): =LTG due to ELOS ? ?Skilled Therapeutic Interventions/Progress Updates:  ?  Pt received seated in recliner in room, agreeable to PT session. No complaints of pain. Does report ongoing lower back soreness and tightness in muscles in her glutes. Pt reports she has been using theracane and it has been helpful at working out trigger points in her glutes and low back. Pt to continue use of theracane for pain management. Sit to stand and transfers with no AD and Supervision throughout session. Ambulation up to 150 ft during session with no AD and close Supervision to CGA for balance. Seated core strengthening/balance while seated on therapy ball with CGA for balance at times: pelvic tilts x 20 reps, R/L lateral hip rock x 20 reps each direction, alt UE lifts x 15 reps, alt LE marches x 10 reps, alt LE LAQ x 10 reps with focus on upright posture and activation of core musculature with exercise. Tandem ambulation with intermittent RUE support 3 x 30 ft, backwards ambulation 3 x 30 ft with CGA to min A for both activities. Static standing balance with no AD in Romberg stance, and L/R staggered stance with min A while performing ball toss against rebounder, 2 x 20 reps in each position. Demonstrated floor transfer and pt able to perform safe lowering the floor and return demonstration of safe floor to standing transfer x 2 reps with CGA for  balance. Pt returned to recliner at end of session, needs in reach. ? ?Therapy Documentation ?Precautions:  ?Precautions ?Precautions: Cervical ?Precaution Booklet Issued: No ?Precaution Comments: verbally reviewed cervical precautions with pt ?Required Braces or Orthoses: Cervical Brace ?Cervical Brace: Hard collar, At all times ?Restrictions ?Weight Bearing Restrictions: No ? ? ? ?Therapy/Group: Individual Therapy ? ? ?Excell Seltzer, PT, DPT,  CSRS ?03/25/2022, 7:59 AM  ?

## 2022-03-25 NOTE — Progress Notes (Signed)
Occupational Therapy Weekly Progress Note ? ?Patient Details  ?Name: Alexandra Schmitt ?MRN: [id number] ?Date of Birth: 04/07/1981 ? ?Beginning of progress report period: Mar 17, 2022 ?End of progress report period: Mar 25, 2022 ? ?Patient has met 4 of 4 short term goals.  Pt made steady progress with BADLs, IADLs, and funcitonal tranfsers since admission. Pt requires supervision for bathing at shower level and all dressing tasks. Amb with RW with supervision in room and in community setting. Pt has participated in community integration outing. IADLs with supervision. ? ?Patient continues to demonstrate the following deficits: muscle weakness and muscle joint tightness, decreased cardiorespiratoy endurance, and decreased standing balance and decreased balance strategies and therefore will continue to benefit from skilled OT intervention to enhance overall performance with BADL and Reduce care partner burden. ? ?Patient progressing toward long term goals..  Continue plan of care. ? ?OT Short Term Goals ?Week 1:  OT Short Term Goal 1 (Week 1): Pt will improve R hand grip strength to 40# and L hand to 20# to improve functional use ?OT Short Term Goal 1 - Progress (Week 1): Met ?OT Short Term Goal 2 (Week 1): Pt will perform 3/3 toileting tasks with CGA ?OT Short Term Goal 2 - Progress (Week 1): Met ?OT Short Term Goal 3 (Week 1): Pt will perform LB ADL with CGA using AE PRN ?OT Short Term Goal 3 - Progress (Week 1): Met ?OT Short Term Goal 4 (Week 1): Pt will perform UB dress over collar with Set up ?OT Short Term Goal 4 - Progress (Week 1): Met ?Week 2:  OT Short Term Goal 1 (Week 2): STG=LTG 2/2 ELOS (continue working towards mod I LTG) ? ? ?Leroy Libman ?03/25/2022, 6:49 AM  ?

## 2022-03-25 NOTE — Progress Notes (Signed)
PROGRESS NOTE   Subjective/Complaints: Patient seen sitting in chair today.  She is going to work on showering with OT tomorrow and is excited about it.  She also reports that the Preparation H cream and Tucks wipes have been effective for her hemorrhoid symptoms.   ROS: Negative for chest pain SOB nausea vomiting constipation diarrhea positive for issues with hemorrhoids and mild constipation.  Sensory changes and weakness which have improving.  Objective:   No results found. No results for input(s): WBC, HGB, HCT, PLT in the last 72 hours. No results for input(s): NA, K, CL, CO2, GLUCOSE, BUN, CREATININE, CALCIUM in the last 72 hours.  Intake/Output Summary (Last 24 hours) at 03/25/2022 1224 Last data filed at 03/24/2022 1800 Gross per 24 hour  Intake 200 ml  Output --  Net 200 ml        Physical Exam: Vital Signs Blood pressure 99/62, pulse 75, temperature 98.6 F (37 C), temperature source Oral, resp. rate 18, height 5\' 6"  (1.676 m), weight 108 kg, last menstrual period 03/07/2022, SpO2 99 %.   Physical exam:  General: Alert and oriented x 3, No apparent distress.  BMI 38.41 HEENT: Head is normocephalic, atraumatic, PERRLA, EOMI, sclera anicteric, oral mucosa pink and moist, dentition intact, ext ear canals clear,  Neck: Supple without JVD or lymphadenopathy Heart: Reg rate and rhythm. No murmurs rubs or gallops Chest: CTA bilaterally without wheezes, rales, or rhonchi; no distress Abdomen: Soft, non-tender, non-distended, bowel sounds positive. Extremities: No clubbing, cyanosis, or edema. Pulses are 2+ Psych: Pt's affect is appropriate. Pt is cooperative Skin: Cervical incision clean dry and intact. Neuro: Alert and oriented x4. Cognition intact.  CN II through XII grossly intact. Decreased sensation throughout all extremities has improved over the past week.  Does say that she does have numbness but does feel some  improvement. Reports some numbness of her fingers on both right and left side, with left side more numb especially left ring finger and pinky. Improved sensation of her bilateral anterior thighs. Musculoskeletal: 4/5 strength throughout.   Assessment/Plan: 1. Functional deficits which require 3+ hours per day of interdisciplinary therapy in a comprehensive inpatient rehab setting. Physiatrist is providing close team supervision and 24 hour management of active medical problems listed below. Physiatrist and rehab team continue to assess barriers to discharge/monitor patient progress toward functional and medical goals  Care Tool:  Bathing    Body parts bathed by patient: Right arm, Left arm, Chest, Abdomen, Front perineal area, Buttocks, Right upper leg, Left upper leg, Face, Right lower leg, Left lower leg   Body parts bathed by helper: Right lower leg, Left lower leg     Bathing assist Assist Level: Supervision/Verbal cueing     Upper Body Dressing/Undressing Upper body dressing   What is the patient wearing?: Bra, Pull over shirt    Upper body assist Assist Level: Set up assist    Lower Body Dressing/Undressing Lower body dressing      What is the patient wearing?: Pants     Lower body assist Assist for lower body dressing: Supervision/Verbal cueing     Toileting Toileting    Toileting assist Assist for toileting: Supervision/Verbal  cueing     Transfers Chair/bed transfer  Transfers assist     Chair/bed transfer assist level: Supervision/Verbal cueing     Locomotion Ambulation   Ambulation assist      Assist level: Supervision/Verbal cueing Assistive device: Walker-rolling Max distance: 400'   Walk 10 feet activity   Assist     Assist level: Supervision/Verbal cueing Assistive device: Walker-rolling   Walk 50 feet activity   Assist    Assist level: Supervision/Verbal cueing Assistive device: Walker-rolling    Walk 150 feet  activity   Assist    Assist level: Supervision/Verbal cueing Assistive device: Walker-rolling    Walk 10 feet on uneven surface  activity   Assist Walk 10 feet on uneven surfaces activity did not occur: Safety/medical concerns   Assist level: Contact Guard/Touching assist Assistive device: Walker-rolling   Wheelchair     Assist Is the patient using a wheelchair?: Yes Type of Wheelchair: Manual    Wheelchair assist level: Supervision/Verbal cueing Max wheelchair distance: 150    Wheelchair 50 feet with 2 turns activity    Assist        Assist Level: Supervision/Verbal cueing   Wheelchair 150 feet activity     Assist      Assist Level: Supervision/Verbal cueing   Blood pressure 99/62, pulse 75, temperature 98.6 F (37 C), temperature source Oral, resp. rate 18, height  (1.676 m), weight 108 kg, last menstrual period 03/07/2022, SpO2 99 %.    Medical Problem List and Plan: 1. 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  Antithrombotics: -DVT/anticoagulation:  Mechanical: Sequential compression devices, below knee Bilateral lower extremities             -antiplatelet therapy: N/A 5/8- OR was 6 days ago- will start Lovenox tomorrow 5/9- started Lovenox 40 mg daily- been 7 days since surgery- went over risks/benefits of not using- pt agreed to Lovenox.  3. Incisional pain: continue Oxycodone prn 5/7 continue oxycodone as needed 5 to 10 mg every 4 hours for severe pain also may utilize Robaxin 500 mg tablet every 6 hours for muscle spasms 5/13 patient reports no issues with pain today. 5/14 Continue current regimen. 4. Mood: LCSW to follow for evaluation and support.               -antipsychotic agents: N/A 5. Neuropsych: This patient is capable of making decisions on her own behalf. 6. Skin/Wound Care: Routine pressure relief measures.  7. Fluids/Electrolytes/Nutrition: Monitor I/O. Check CMET in am 8. ADHD/Anxiety: In the past-->no meds for years. 9.  Myalgias: add kpad, continue prn robaxin.  5/7 continue utilize Robaxin 500 mg tablet every 6 hours for muscle spasms 10. Morbid obesity: BMI 38.41: provide dietary education 5/7 continue dietary education as well as physical therapy and progressive exercise 11. Burning thoracic pain; lumbar MRI imaging suggestive of thoracic spine pedicle fractures: CT ordered to further assess. 5/5 MRI findings are described as below: IMPRESSION: 1. T1 hypointensity with associated T2/STIR hyperintensity in the  bilateral T12 pedicles, right more than left, raise suspicion for acute to subacute fractures. Correlate with point tenderness, and consider CT to evaluate for a fracture plane. 2. Prominent central protrusion at L4-L5 which narrows the subarticular zones and may irritate the traversing right L5 nerve root. 3. Overall mild degenerative changes throughout the remainder of the lumbar spine as above without significant spinal canal or neural foraminal stenosis. 5/6 CT today showed results that did not indicate fracture. Impression findings are as described below: 1. Partially visible postoperative soft tissue inflammation in the lower neck status post recent cervical ACDF. Minimal associated inflammation in the superior mediastinum. 2. No acute osseous abnormality in the thoracic spine. Congenital T9 and T12 butterfly vertebrae. And degenerative appearing sclerosis of the bilateral T12 pedicles and costovertebral junctions. 3. 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 -5/6 May continue to utilize pain strategies as outlined above. 12. Constipation: Senna 2 tabs HS -5/6 continue senna 2 tabs at bedtime continue mobility to promote bowel function.  May utilize as needed's if needed does have available MiraLAX 17 g as needed for mild constipation Dulcolax suppository 10 mg for moderate constipation and a fleets enema for severe constipation. -LBM charted as yesterday 03/16/2022. 5/7 LBM today, pt reports she feels relieved.  5/8- LBM yesterday- not constipated anymore             5/9- LBM-s mall BM yesterday- thinks she can go today- don't want her to get backed up again.         5/11- LBM 2 days ago but feels could go today.          5/12- LBM yesterday- medium         5/13 No BM today, pt reports issues with external hemorrhoids with   LBM 5/14 BM last night, reports hemorrhoid relief. 13.  Hemorrhoids external, chronic 5/13 No BM today, pt reports issues with external hemorrhoids with LBM 5/13 placed order today for Preparation H cream and Tucks pads for symptomatic relief.  If persistent may see GI at discharge as an outpatient. 5/14 BM last night, reports hemorrhoid relief with Tucks pads and Prep H cream. 14.  Advance diet 5/13 patient is requesting to advance diet from dysphagia 3 to regular diet per recommendations by Dr. Skip Estimable, will schedule SLP consult to ensure that it is safe to advance.  Will update diet after SLP sees patient. 5/14 Per SLP diet was advanced to Regular on 03/24/22.   LOS: 9 days A FACE TO FACE EVALUATION WAS PERFORMED  Tressia Miners 03/25/2022, 12:24 PM

## 2022-03-26 DIAGNOSIS — Z7409 Other reduced mobility: Secondary | ICD-10-CM

## 2022-03-26 DIAGNOSIS — G629 Polyneuropathy, unspecified: Secondary | ICD-10-CM

## 2022-03-26 LAB — BASIC METABOLIC PANEL
Anion gap: 8 (ref 5–15)
BUN: 8 mg/dL (ref 6–20)
CO2: 24 mmol/L (ref 22–32)
Calcium: 9 mg/dL (ref 8.9–10.3)
Chloride: 104 mmol/L (ref 98–111)
Creatinine, Ser: 0.64 mg/dL (ref 0.44–1.00)
GFR, Estimated: >60 mL/min (ref >60)
Glucose, Bld: 97 mg/dL (ref 70–99)
Potassium: 4 mmol/L (ref 3.5–5.1)
Sodium: 136 mmol/L (ref 135–145)

## 2022-03-26 LAB — CBC
HCT: 39.6 % (ref 36.0–46.0)
Hemoglobin: 13.4 g/dL (ref 12.0–15.0)
MCH: 29.3 pg (ref 26.0–34.0)
MCHC: 33.8 g/dL (ref 30.0–36.0)
MCV: 86.7 fL (ref 80.0–100.0)
Platelets: 414 10*3/uL — ABNORMAL HIGH (ref 150–400)
RBC: 4.57 MIL/uL (ref 3.87–5.11)
RDW: 13.4 % (ref 11.5–15.5)
WBC: 7.1 10*3/uL (ref 4.0–10.5)
nRBC: 0 % (ref 0.0–0.2)

## 2022-03-26 NOTE — Progress Notes (Signed)
Occupational Therapy Session Note ? ?Patient Details  ?Name: NOVELLA ABRAHA ?MRN: [id number] ?Date of Birth: 1981-03-23 ? ?Today's Date: 03/26/2022 ?OT Individual Time: 6967-8938 ?OT Individual Time Calculation (min): 45 min  ? ? ?Short Term Goals: ?Week 2:  OT Short Term Goal 1 (Week 2): STG=LTG 2/2 ELOS (continue working towards mod I LTG) ? ?Skilled Therapeutic Interventions/Progress Updates:  ?  OT intervention with focus on functional amb with/without RW, standing balance, activity tolerance, and safety awareness to increase independence with BADLs. Pt amb with RW to day room. Wii activities with balance board-penguin balance and tilt table balance with CGA-with/without RW. Pt with increased ability to moderate weight shifts. Pt also engaged in Wii boweling, standing for entire game. Pt returned to room. Pt amb wihtout RW but carrying RW in both arms. Pt recturend to recliner. All needs within reach.  ? ?Therapy Documentation ?Precautions:  ?Precautions ?Precautions: Cervical ?Precaution Booklet Issued: No ?Precaution Comments: verbally reviewed cervical precautions with pt ?Required Braces or Orthoses: Cervical Brace ?Cervical Brace: Hard collar, At all times ?Restrictions ?Weight Bearing Restrictions: No ?  ?Pain: ?Pt denies pain this morning ? ? ?Therapy/Group: Individual Therapy ? ?Rich Brave ?03/26/2022, 11:17 AM ?

## 2022-03-26 NOTE — Progress Notes (Signed)
?                                                       PROGRESS NOTE ? ? ?Subjective/Complaints:  ?Asks if her Lovenox can be discontinued. Discussed that since she is walking so well, this would be fine.  ?She otherwise has no complaints ? ? ?ROS: Denies chest pain SOB nausea vomiting constipation diarrhea positive for issues with hemorrhoids and mild constipation.  Sensory changes and weakness which have improving. ? ?Objective: ?  ?No results found. ?Recent Labs  ?  03/26/22 ?0656  ?WBC 7.1  ?HGB 13.4  ?HCT 39.6  ?PLT 414*  ? ?Recent Labs  ?  03/26/22 ?0656  ?NA 136  ?K 4.0  ?CL 104  ?CO2 24  ?GLUCOSE 97  ?BUN 8  ?CREATININE 0.64  ?CALCIUM 9.0  ? ? ?Intake/Output Summary (Last 24 hours) at 03/26/2022 1305 ?Last data filed at 03/25/2022 1700 ?Gross per 24 hour  ?Intake 236 ml  ?Output --  ?Net 236 ml  ?  ? ?  ? ?Physical Exam: ?Vital Signs ?Blood pressure 110/70, pulse 84, temperature 98.7 ?F (37.1 ?C), temperature source Oral, resp. rate 18, height 5\' 6"  (1.676 m), weight 108 kg, last menstrual period 03/07/2022, SpO2 98 %. ? ? ?Physical exam: ? ?General: Alert and oriented x 3, No apparent distress.  BMI 38.41 ?HEENT: Head is normocephalic, atraumatic, PERRLA, EOMI, sclera anicteric, oral mucosa pink and moist, dentition intact, ext ear canals clear,  ?Neck: Supple without JVD or lymphadenopathy ?Heart: Reg rate and rhythm. No murmurs rubs or gallops ?Chest: CTA bilaterally without wheezes, rales, or rhonchi; no distress ?Abdomen: Soft, non-tender, non-distended, bowel sounds positive. ?Extremities: No clubbing, cyanosis, or edema. Pulses are 2+ ?Psych: Pt's affect is appropriate. Pt is cooperative ?Skin: Cervical incision clean dry and intact. ?Neuro: Alert and oriented x4. Cognition intact.  CN II through XII grossly intact. Decreased sensation throughout all extremities has improved over the past week.  Does say that she does have numbness but does feel some improvement. Reports some numbness of her fingers  on both right and left side, with left side more numb especially left ring finger and pinky. Improved sensation of her bilateral anterior thighs. ?Musculoskeletal: 4/5 strength throughout.  ?Ambulating S w/ RW ? ?Assessment/Plan: ?1. Functional deficits which require 3+ hours per day of interdisciplinary therapy in a comprehensive inpatient rehab setting. ?Physiatrist is providing close team supervision and 24 hour management of active medical problems listed below. ?Physiatrist and rehab team continue to assess barriers to discharge/monitor patient progress toward functional and medical goals ? ?Care Tool: ? ?Bathing ?   ?Body parts bathed by patient: Right arm, Left arm, Chest, Abdomen, Front perineal area, Buttocks, Right upper leg, Left upper leg, Face, Right lower leg, Left lower leg  ? Body parts bathed by helper: Right lower leg, Left lower leg ?  ?  ?Bathing assist Assist Level: Supervision/Verbal cueing ?  ?  ?Upper Body Dressing/Undressing ?Upper body dressing   ?What is the patient wearing?: Bra, Pull over shirt ?   ?Upper body assist Assist Level: Set up assist ?   ?Lower Body Dressing/Undressing ?Lower body dressing ? ? ?   ?What is the patient wearing?: Pants ? ?  ? ?Lower body assist Assist for lower body dressing: Supervision/Verbal cueing ?   ? ?  Toileting ?Toileting    ?Toileting assist Assist for toileting: Supervision/Verbal cueing ?  ?  ?Transfers ?Chair/bed transfer ? ?Transfers assist ?   ? ?Chair/bed transfer assist level: Supervision/Verbal cueing ?  ?  ?Locomotion ?Ambulation ? ? ?Ambulation assist ? ?   ? ?Assist level: Supervision/Verbal cueing ?Assistive device: No Device ?Max distance: 500'  ? ?Walk 10 feet activity ? ? ?Assist ?   ? ?Assist level: Supervision/Verbal cueing ?Assistive device: No Device  ? ?Walk 50 feet activity ? ? ?Assist   ? ?Assist level: Supervision/Verbal cueing ?Assistive device: No Device  ? ? ?Walk 150 feet activity ? ? ?Assist   ? ?Assist level:  Supervision/Verbal cueing ?Assistive device: No Device ?  ? ?Walk 10 feet on uneven surface  ?activity ? ? ?Assist Walk 10 feet on uneven surfaces activity did not occur: Safety/medical concerns ? ? ?Assist level: Contact Guard/Touching assist ?Assistive device: Walker-rolling  ? ?Wheelchair ? ? ? ? ?Assist Is the patient using a wheelchair?: Yes ?Type of Wheelchair: Manual ?  ? ?Wheelchair assist level: Supervision/Verbal cueing ?Max wheelchair distance: 150  ? ? ?Wheelchair 50 feet with 2 turns activity ? ? ? ?Assist ? ?  ?  ? ? ?Assist Level: Supervision/Verbal cueing  ? ?Wheelchair 150 feet activity  ? ? ? ?Assist ?   ? ? ?Assist Level: Supervision/Verbal cueing  ? ?Blood pressure 110/70, pulse 84, temperature 98.7 ?F (37.1 ?C), temperature source Oral, resp. rate 18, height 5\' 6"  (1.676 m), weight 108 kg, last menstrual period 03/07/2022, SpO2 98 %. ? ? ? ?Medical Problem List and Plan: ?1. Functional deficits secondary to cervical myelopathy ?            -patient may shower but incision must be covered ?            -ELOS/Goals: modI 10-14 days        ?            -Admit to CIR, d/c date 03/28/22 ?Conitnue CIR- PT, OT - we discussed will need f/u with me after d/c- also will need pain meds at d/c and d/c 9-11am ?2.  Impaired mobility: ambulating much better now, can d/c lovenox.  ?3. Incisional pain: continue Oxycodone prn ?5/7 continue oxycodone as needed 5 to 10 mg every 4 hours for severe pain also may utilize Robaxin 500 mg tablet every 6 hours for muscle spasms ?5/13 patient reports no issues with pain today. ?5/14 Continue current regimen. ?4. Mood: LCSW to follow for evaluation and support.  ?            -antipsychotic agents: N/A ?5. Neuropsych: This patient is capable of making decisions on her own behalf. ?6. Skin/Wound Care: Routine pressure relief measures.  ?7. Fluids/Electrolytes/Nutrition: Monitor I/O. Check CMET in am ?8. ADHD/Anxiety: In the past-->no meds for years. ?9.  Myalgias: add kpad,  continue prn robaxin.  ?5/7 continue utilize Robaxin 500 mg tablet every 6 hours for muscle spasms ?10. Morbid obesity: BMI 38.41: provide dietary education ?5/7 continue dietary education as well as physical therapy and progressive exercise ?11. Burning thoracic pain; lumbar MRI imaging suggestive of thoracic spine pedicle fractures: CT ordered to further assess. ?5/5 MRI findings are described as below: ?IMPRESSION: ?1. T1 hypointensity with associated T2/STIR hyperintensity in the ?bilateral T12 pedicles, right more than left, raise suspicion for ?acute to subacute fractures. Correlate with point tenderness, and ?consider CT to evaluate for a fracture plane. ?2. Prominent central protrusion at L4-L5 which narrows the ?  subarticular zones and may irritate the traversing right L5 nerve ?root. ?3. Overall mild degenerative changes throughout the remainder of the ?lumbar spine as above without significant spinal canal or neural ?foraminal stenosis. ?5/6 CT today showed results that did not indicate fracture. ?Impression findings are as described below: ?1. Partially visible postoperative soft tissue inflammation in the ?lower neck status post recent cervical ACDF. Minimal associated ?inflammation in the superior mediastinum. ?2. No acute osseous abnormality in the thoracic spine. 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 And degenerative appearing sclerosis of ?the bilateral T12 pedicles and costovertebral junctions. ?3. 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 ?-5/6 May continue to utilize pain strategies as outlined above. ?12. Constipation: Senna 2 tabs HS ?-5/6 continue senna 2 tabs at bedtime continue mobility to promote bowel function.  May utilize as needed's if  needed does have available MiraLAX 17 g as needed for mild constipation Dulcolax suppository 10 mg for moderate constipation and a fleets enema for severe constipation. ?-LBM charted as yesterday 03/16/2022. ?5/7 LBM today, pt

## 2022-03-26 NOTE — Progress Notes (Signed)
Physical Therapy Session Note ? ?Patient Details  ?Name: Alexandra Schmitt ?MRN: [id number] ?Date of Birth: 06/10/1981 ? ?Today's Date: 03/26/2022 ?PT Individual Time: [id number] ?PT Individual Time Calculation (min): 85 min  ? ?Short Term Goals: ?Week 2:  PT Short Term Goal 1 (Week 2): =LTG due to ELOS ? ?Skilled Therapeutic Interventions/Progress Updates:  ?  Pt received seated in recliner in room, agreeable to PT session. Pt reports some soreness in her lower back muscles, working on stretches for pain management. No other complaints of pain and declines further intervention. Sit to stand with no AD and Supervision throughout session. Ambulation up to 500 ft with no AD at Supervision level throughout session. Ascend/descend several flights of stairs (12 x 6") with R handrail laterally with Supervision. Sit to/from supine on flat mat table with Supervision. Supine bridges x 10 reps, supine thoracic T- stretch on towel roll x 10 reps. Ambulation through obstacle course with no AD and CGA performing cone taps with side-steps, ascend/descend wedge, and stance on airex with min A for balance x 30 sec. Quadruped alt LE then UE lifts 2 x 10 reps each. Pt exhibits decreased control when lifting LLE to maintain midline due to R hip weakness. Seated 3# volleyball for UB strengthening 3 x 15 reps to fatigue. Provided HEP handout for patient, see link below. Re-assessed TUG and assessed Berg and FGA with patient, see details below. Pt improved score on TUG from 32.9 sec on 5/6 to 8.6 sec this date. Pt left seated in recliner in room with needs in reach at end of session. ? ?Access Code: [email or web address] ?URL: [email or web address] ?Date: 03/26/2022 ?Prepared by: Excell Seltzer ? ?Exercises ?- Supine Lower Trunk Rotation  - 1 x daily - 7 x weekly - 1-2 sets - 10 reps ?- Supine Bridge  - 1 x daily - 7 x weekly - 3 sets - 10 reps ?- Supine Single Knee to Chest Stretch  - 1-2 x daily - 7 x weekly - 2 sets - 5 reps - 60 hold ?-  Supine Posterior Pelvic Tilt  - 1 x daily - 7 x weekly - 1-2 sets - 10 reps ?- Seated Piriformis Stretch with Trunk Bend  - 1 x daily - 7 x weekly - 1 sets - 5 reps - 60 hold ?- Seated Hamstring Stretch with Strap  - 1 x daily - 7 x weekly - 1 sets - 5 reps - 60 hold ?- Long Sitting Calf Stretch with Strap  - 1 x daily - 7 x weekly - 1 sets - 5 reps - 60 hold ?- Gluteus Mobilization with Foam Roll  - 1 x daily - 7 x weekly - 1-2 sets - 10 reps ?- Swiss Ball March  - 1 x daily - 7 x weekly - 3 sets - 10 reps ?- Seated Lateral Pelvic Tilt on Swiss Ball  - 1 x daily - 7 x weekly - 3 sets - 10 reps ?- Seated March with Opposite Arm Flexion on Swiss Ball  - 1 x daily - 7 x weekly - 3 sets - 10 reps ?- Swiss Ball Knee Extension  - 1 x daily - 7 x weekly - 3 sets - 10 reps ? ? ?Therapy Documentation ?Precautions:  ?Precautions ?Precautions: Cervical ?Precaution Booklet Issued: No ?Precaution Comments: verbally reviewed cervical precautions with pt ?Required Braces or Orthoses: Cervical Brace ?Cervical Brace: Hard collar, At all times ?Restrictions ?Weight Bearing Restrictions: No ? ?Balance: ?Standardized Balance Assessment ?Standardized Balance Assessment: (P)  Berg Balance Test;Timed Up and Go Test;Functional Gait Assessment ?Berg Balance Test ?Sit to Stand: (P) Able to stand without using hands and stabilize independently ?Standing Unsupported: (P) Able to stand safely 2 minutes ?Sitting with Back Unsupported but Feet Supported on Floor or Stool: (P) Able to sit safely and securely 2 minutes ?Stand to Sit: (P) Sits safely with minimal use of hands ?Transfers: (P) Able to transfer safely, minor use of hands ?Standing Unsupported with Eyes Closed: (P) Able to stand 10 seconds with supervision ?Standing Ubsupported with Feet Together: (P) Able to place feet together independently and stand for 1 minute with supervision ?From Standing, Reach Forward with Outstretched Arm: (P) Reaches forward but needs supervision ?From  Standing Position, Pick up Object from Floor: (P) Able to pick up shoe, needs supervision ?From Standing Position, Turn to Look Behind Over each Shoulder: (P) Turn sideways only but maintains balance ?Turn 360 Degrees: (P) Able to turn 360 degrees safely but slowly ?Standing Unsupported, Alternately Place Feet on Step/Stool: (P) Able to complete 4 steps without aid or supervision ?Standing Unsupported, One Foot in Front: (P) Able to place foot tandem independently and hold 30 seconds ?Standing on One Leg: (P) Able to lift leg independently and hold 5-10 seconds ?Total Score: (P) 43 ?Timed Up and Go Test ?TUG: (P) Normal TUG ?Normal TUG (seconds): (P) 8.6 ?Functional Gait  Assessment ?Gait assessed : (P) Yes ?Gait Level Surface: (P) Walks 20 ft in less than 5.5 sec, no assistive devices, good speed, no evidence for imbalance, normal gait pattern, deviates no more than 6 in outside of the 12 in walkway width. ?Change in Gait Speed: (P) Able to change speed, demonstrates mild gait deviations, deviates 6-10 in outside of the 12 in walkway width, or no gait deviations, unable to achieve a major change in velocity, or uses a change in velocity, or uses an assistive device. ?Gait with Horizontal Head Turns: (P) Performs head turns with moderate changes in gait velocity, slows down, deviates 10-15 in outside 12 in walkway width but recovers, can continue to walk. (limited due to cervical precautions) ?Gait with Vertical Head Turns: (P) Performs task with moderate change in gait velocity, slows down, deviates 10-15 in outside 12 in walkway width but recovers, can continue to walk. (limited due to cervical precautions) ?Gait and Pivot Turn: (P) Pivot turns safely in greater than 3 sec and stops with no loss of balance, or pivot turns safely within 3 sec and stops with mild imbalance, requires small steps to catch balance. ?Step Over Obstacle: (P) Is able to step over one shoe box (4.5 in total height) without changing gait  speed. No evidence of imbalance. ?Gait with Narrow Base of Support: (P) Ambulates 7-9 steps. ?Gait with Eyes Closed: (P) Walks 20 ft, uses assistive device, slower speed, mild gait deviations, deviates 6-10 in outside 12 in walkway width. Ambulates 20 ft in less than 9 sec but greater than 7 sec. ?Ambulating Backwards: (P) Walks 20 ft, uses assistive device, slower speed, mild gait deviations, deviates 6-10 in outside 12 in walkway width. ?Steps: (P) Two feet to a stair, must use rail. ?Total Score: (P) 18 ? ? ? ? ?Therapy/Group: Individual Therapy ? ? ?Excell Seltzer, PT, DPT, CSRS ?03/26/2022, 12:06 PM  ?

## 2022-03-26 NOTE — Progress Notes (Signed)
Occupational Therapy Session Note ? ?Patient Details  ?Name: Alexandra Schmitt ?MRN: [id number] ?Date of Birth: 01/18/1981 ? ?Today's Date: 03/26/2022 ?OT Individual Time: [id number] ?OT Individual Time Calculation (min): 70 min  ? ? ?Short Term Goals: ?Week 2:  OT Short Term Goal 1 (Week 2): STG=LTG 2/2 ELOS (continue working towards mod I LTG) ? ?Skilled Therapeutic Interventions/Progress Updates:  ?  Pt sitting EOB upon arrival. Pt amb with RW in room to gather clothing and supplies before entering bathroom to complete shower with sit<>stand from TTB. Pt returned to room and completed dressing with sit<>stand from EOB. Pt completed all tasks with supervision. Pt amb with RW to day room and practiced using keyboard on computer. Pt reports her fingers are hypersensitive but is able to tolerate and noticed improvement with repetition. Pt engaged in [facility] tasks tearing paper and forming small balls before flicking with index and middle finger onto floor. Pt retrieved paper from floor with reacher. Pt amb with RW to gym and retieved top bed sheet and transported to ADL apartment. Pt practiced making bed up and using bed for support when walking around bed. Pt transported sheet to dirty linen bag and returned to room. Pt completed all tasks with supervision. Pt remained in recliner with all needs within reach. ? ?Therapy Documentation ?Precautions:  ?Precautions ?Precautions: Cervical ?Precaution Booklet Issued: No ?Precaution Comments: verbally reviewed cervical precautions with pt ?Required Braces or Orthoses: Cervical Brace ?Cervical Brace: Hard collar, At all times ?Restrictions ?Weight Bearing Restrictions: No ?Pain: ? Pt reports minor tightness across shoulders and through back; warm shower and activity ? ? ? ?Therapy/Group: Individual Therapy ? ?Leroy Libman ?03/26/2022, 8:11 AM ?

## 2022-03-27 DIAGNOSIS — K5900 Constipation, unspecified: Secondary | ICD-10-CM

## 2022-03-27 DIAGNOSIS — M792 Neuralgia and neuritis, unspecified: Secondary | ICD-10-CM

## 2022-03-27 DIAGNOSIS — R1310 Dysphagia, unspecified: Secondary | ICD-10-CM

## 2022-03-27 NOTE — Progress Notes (Signed)
Occupational Therapy Discharge Summary ? ?Patient Details  ?Name: Alexandra Schmitt ?MRN: [id number] ?Date of Birth: [birth year] ? ?Patient has met 14 of 14 long term goals due to improved activity tolerance, improved balance, postural control, ability to compensate for deficits, and improved coordination.  Pt made excellent progress with BADLs and IADLs during this admission. Pt is mod I for all tasks. Pt verbalizes understanding of all recommendations. And is able to direct care of changing pads on cervical collar.Patient to discharge at overall Modified Independent level.  Patient's care partner is independent to provide the necessary physical assistance at discharge.   ? ?Reasons goals not met: n/a ? ?Recommendation:  ?Patient will benefit from ongoing skilled OT services in outpatient setting to continue to advance functional skills in the area of BADL, iADL, and Reduce care partner burden. ? ?Equipment: ?No equipment provided ? ?Reasons for discharge: treatment goals met and discharge from hospital ? ?Patient/family agrees with progress made and goals achieved: Yes ? ?OT Discharge ?ADL ?ADL ?Eating: Independent ?Grooming: Independent ?Upper Body Bathing: Modified independent ?Where Assessed-Upper Body Bathing: Shower ?Lower Body Bathing: Modified independent ?Where Assessed-Lower Body Bathing: Shower ?Upper Body Dressing: Independent ?Where Assessed-Upper Body Dressing: Chair ?Lower Body Dressing: Modified independent ?Where Assessed-Lower Body Dressing: Chair ?Toileting: Modified independent ?Where Assessed-Toileting: Toilet ?Toilet Transfer: Modified independent ?Toilet Transfer Method: Ambulating ?Walk-In Shower Transfer: Modified independent ?Walk-In Shower Transfer Method: Ambulating ?Walk-In Shower Equipment: Civil engineer, contracting with back ?Vision ?Baseline Vision/History: 0 No visual deficits ?Patient Visual Report: No change from baseline ?Vision Assessment?: No apparent visual deficits ?Perception  ?Perception:  Within Functional Limits ?Praxis ?Praxis: Intact ?Cognition ?Cognition ?Overall Cognitive Status: Within Functional Limits for tasks assessed ?Arousal/Alertness: Awake/alert ?Orientation Level: Person;Place;Situation ?Person: Oriented ?Place: Oriented ?Situation: Oriented ?Memory: Appears intact ?Attention: Selective ?Focused Attention: Appears intact ?Selective Attention: Appears intact ?Awareness: Appears intact ?Problem Solving: Appears intact ?Safety/Judgment: Appears intact ?Brief Interview for Mental Status (BIMS) ?Repetition of Three Words (First Attempt): 3 ?Temporal Orientation: Year: Correct ?Temporal Orientation: Month: Accurate within 5 days ?Temporal Orientation: Day: Correct ?Recall: "Sock": Yes, no cue required ?Recall: "Blue": Yes, no cue required ?Recall: "Bed": Yes, no cue required ?BIMS Summary Score: 15 ?Sensation ?Sensation ?Light Touch: Impaired Detail ?Peripheral sensation comments: impaired distal>proximl, inconsistent sensation deficits in thighs and bottoms of feet as well as L hand ulnar side ?Light Touch Impaired Details: Impaired RLE;Impaired LLE;Impaired RUE;Impaired LUE ?Hot/Cold: Appears Intact ?Proprioception: Appears Intact ?Stereognosis: Not tested ?Coordination ?Gross Motor Movements are Fluid and Coordinated: Yes ?Fine Motor Movements are Fluid and Coordinated: Yes ?Finger Nose Finger Test: WNL ?Motor  ?Motor ?Motor: Other (comment) ?Motor - Skilled Clinical Observations: limited coordination 2/2 BUE weakness and sensation deficits ?   ?Trunk/Postural Assessment  ?Cervical Assessment ?Cervical Assessment: Exceptions to [facility] (cervical collar) ?Thoracic Assessment ?Thoracic Assessment: Exceptions to [facility] ?Lumbar Assessment ?Lumbar Assessment: Within Functional Limits ?Postural Control ?Postural Control: Within Functional Limits  ?Balance ?Dynamic Sitting Balance ?Dynamic Sitting - Balance Support: Feet supported;During functional activity ?Dynamic Sitting - Level of Assistance: 6:  Modified independent (Device/Increase time) ?Extremity/Trunk Assessment ?RUE Assessment ?RUE Assessment: Within Functional Limits ?Active Range of Motion (AROM) Comments: WFL ?General Strength Comments: 4+/5 ?LUE Assessment ?LUE Assessment: Within Functional Limits ?Active Range of Motion (AROM) Comments: WFL ?General Strength Comments: 4+/5 ? ? ?Leroy Libman ?03/27/2022, 6:34 AM ?

## 2022-03-27 NOTE — Progress Notes (Signed)
Physical Therapy Discharge Summary ? ?Patient Details  ?Name: Alexandra Schmitt ?MRN: [id number] ?Date of Birth: 1981-11-07 ? ? ?Patient has met 9 of 9 long term goals due to improved activity tolerance, improved balance, improved postural control, increased strength, and ability to compensate for deficits.  Patient to discharge at an ambulatory level Modified Independent.   Patient's care partner  is not needed as pt is at mod I level . ? ?Reasons goals not met: Pt has met all rehab goals. ? ?Recommendation:  ?Patient will benefit from ongoing skilled PT services in outpatient setting to continue to advance safe functional mobility, address ongoing impairments in endurance, balance, strength, safety, independence with functional mobility, coordination, FMC, and minimize fall risk. ? ?Equipment: ?RW ? ?Reasons for discharge: treatment goals met and discharge from hospital ? ?Patient/family agrees with progress made and goals achieved: Yes ? ?PT Discharge ?Precautions/Restrictions ?Precautions ?Precautions: Cervical ?Precaution Comments: verbally reviewed cervical precautions with pt ?Required Braces or Orthoses: Cervical Brace ?Cervical Brace: Hard collar;At all times ?Restrictions ?Weight Bearing Restrictions: No ?Pain Interference ?Pain Interference ?Pain Effect on Sleep: 2. Occasionally ?Pain Interference with Therapy Activities: 2. Occasionally ?Pain Interference with Day-to-Day Activities: 2. Occasionally ?Vision/Perception  ?Perception ?Perception: Within Functional Limits ?Praxis ?Praxis: Intact  ?Cognition ?Overall Cognitive Status: Within Functional Limits for tasks assessed ?Arousal/Alertness: Awake/alert ?Orientation Level: Oriented X4 ?Attention: Selective ?Focused Attention: Appears intact ?Selective Attention: Appears intact ?Memory: Appears intact ?Awareness: Appears intact ?Problem Solving: Appears intact ?Safety/Judgment: Appears intact ?Sensation ?Sensation ?Light Touch: Impaired Detail ?Peripheral  sensation comments: impaired distal>proximl, inconsistent sensation deficits in thighs and bottoms of feet as well as L hand ulnar side ?Light Touch Impaired Details: Impaired RLE;Impaired LLE;Impaired RUE;Impaired LUE ?Proprioception: Appears Intact ?Coordination ?Gross Motor Movements are Fluid and Coordinated: Yes ?Fine Motor Movements are Fluid and Coordinated: Yes ?Coordination and Movement Description: improved since eval ?Motor  ?Motor ?Motor: Other (comment) ?Motor - Skilled Clinical Observations: limited coordination 2/2 BUE weakness and sensation deficits  ?Mobility ?Bed Mobility ?Bed Mobility: Rolling Right;Rolling Left;Supine to Sit;Sit to Supine ?Rolling Right: Independent ?Rolling Left: Independent ?Supine to Sit: Independent ?Sit to Supine: Independent ?Transfers ?Transfers: Sit to Stand;Stand to [employer] Transfers ?Sit to Stand: Independent with assistive device ?Stand to Sit: Independent with assistive device ?Stand Pivot Transfers: Independent with assistive device ?Transfer (Assistive device): None ?Locomotion  ?Gait ?Ambulation: Yes ?Gait Assistance: Independent with assistive device ?Gait Distance (Feet): 1000 Feet ?Assistive device: Rolling walker ?Gait ?Gait: Yes ?Gait Pattern: Within Functional Limits ?Stairs / Additional Locomotion ?Stairs: Yes ?Stairs Assistance: Independent with assistive device ?Stair Management Technique: One rail Right;Sideways ?Number of Stairs: 12 ?Height of Stairs: 6 ?Ramp: Independent with assistive device ?Curb: Independent with assistive device ?Wheelchair Mobility ?Wheelchair Mobility: No  ?Trunk/Postural Assessment  ?Cervical Assessment ?Cervical Assessment: Exceptions to [facility] (cervical precautions; hard collar AAT) ?Thoracic Assessment ?Thoracic Assessment: Exceptions to [facility] ?Lumbar Assessment ?Lumbar Assessment: Within Functional Limits ?Postural Control ?Postural Control: Within Functional Limits  ?Balance ?Balance ?Balance Assessed: Yes ?Standardized  Balance Assessment ?Standardized Balance Assessment: Berg Balance Test;Timed Up and Go Test;Functional Gait Assessment ?Berg Balance Test ?Sit to Stand: Able to stand without using hands and stabilize independently ?Standing Unsupported: Able to stand safely 2 minutes ?Sitting with Back Unsupported but Feet Supported on Floor or Stool: Able to sit safely and securely 2 minutes ?Stand to Sit: Sits safely with minimal use of hands ?Transfers: Able to transfer safely, minor use of hands ?Standing Unsupported with Eyes Closed: Able to stand 10 seconds with supervision ?Standing  Ubsupported with Feet Together: Able to place feet together independently and stand for 1 minute with supervision ?From Standing, Reach Forward with Outstretched Arm: Reaches forward but needs supervision ?From Standing Position, Pick up Object from Floor: Able to pick up shoe, needs supervision ?From Standing Position, Turn to Look Behind Over each Shoulder: Turn sideways only but maintains balance ?Turn 360 Degrees: Able to turn 360 degrees safely but slowly ?Standing Unsupported, Alternately Place Feet on Step/Stool: Able to complete 4 steps without aid or supervision ?Standing Unsupported, One Foot in Front: Able to place foot tandem independently and hold 30 seconds ?Standing on One Leg: Able to lift leg independently and hold 5-10 seconds ?Total Score: 43 ?Timed Up and Go Test ?TUG: Normal TUG ?Normal TUG (seconds): 8.6 ?Dynamic Sitting Balance ?Dynamic Sitting - Balance Support: No upper extremity supported;Feet supported;During functional activity ?Dynamic Sitting - Level of Assistance: 7: Independent ?Static Standing Balance ?Static Standing - Balance Support: No upper extremity supported;During functional activity ?Static Standing - Level of Assistance: 6: Modified independent (Device/Increase time) ?Dynamic Standing Balance ?Dynamic Standing - Balance Support: No upper extremity supported;During functional activity ?Dynamic Standing -  Level of Assistance: 6: Modified independent (Device/Increase time) ?Functional Gait  Assessment ?Gait assessed : Yes ?Gait Level Surface: Walks 20 ft in less than 5.5 sec, no assistive devices, good speed, no evidence for imbalance, normal gait pattern, deviates no more than 6 in outside of the 12 in walkway width. ?Change in Gait Speed: Able to change speed, demonstrates mild gait deviations, deviates 6-10 in outside of the 12 in walkway width, or no gait deviations, unable to achieve a major change in velocity, or uses a change in velocity, or uses an assistive device. ?Gait with Horizontal Head Turns: Performs head turns with moderate changes in gait velocity, slows down, deviates 10-15 in outside 12 in walkway width but recovers, can continue to walk. ?Gait with Vertical Head Turns: Performs task with moderate change in gait velocity, slows down, deviates 10-15 in outside 12 in walkway width but recovers, can continue to walk. ?Gait and Pivot Turn: Pivot turns safely in greater than 3 sec and stops with no loss of balance, or pivot turns safely within 3 sec and stops with mild imbalance, requires small steps to catch balance. ?Step Over Obstacle: Is able to step over one shoe box (4.5 in total height) without changing gait speed. No evidence of imbalance. ?Gait with Narrow Base of Support: Ambulates 7-9 steps. ?Gait with Eyes Closed: Walks 20 ft, uses assistive device, slower speed, mild gait deviations, deviates 6-10 in outside 12 in walkway width. Ambulates 20 ft in less than 9 sec but greater than 7 sec. ?Ambulating Backwards: Walks 20 ft, uses assistive device, slower speed, mild gait deviations, deviates 6-10 in outside 12 in walkway width. ?Steps: Two feet to a stair, must use rail. ?Total Score: 18 ?FGA comment:: unable to fully perform head turns due to cervical precautions ?Extremity Assessment   ?RLE Assessment ?RLE Assessment: Exceptions to [facility] ?RLE Strength ?Right Hip Flexion: 3+/5 ?Right Knee  Flexion: 4/5 ?Right Knee Extension: 4-/5 ?Right Ankle Dorsiflexion: 4-/5 ?Right Ankle Plantar Flexion: 4-/5 ?LLE Assessment ?LLE Assessment: Exceptions to [facility] ?LLE Strength ?Left Hip Flexion: 4/5 ?Left Knee F

## 2022-03-27 NOTE — Progress Notes (Signed)
Occupational Therapy Session Note ? ?Patient Details  ?Name: Alexandra Schmitt ?MRN: [id number] ?Date of Birth: [birth year] ? ?Today's Date: 03/27/2022 ?OT Individual Time: [phone] ?OT Individual Time Calculation (min): 73 min  ? ? ?Short Term Goals: ?Week 2:  OT Short Term Goal 1 (Week 2): STG=LTG 2/2 ELOS (continue working towards mod I LTG) ? ?Skilled Therapeutic Interventions/Progress Updates:  ?  Pt resting in bed upon arrival. Pt declined bathing and changing clothing this morning because she only had one more set of clothing to go home in tomorrow. Pt amb in room without AD to use bathroom and complete grooming tasks. Pt amb wihtout AD throughout hallway to gym and day room. NuStep level 6 for 7 mins. Pt amb while bouncing large therapy ball. No LOB. Pt amb while tossing smaller therapy ball in air. Slight LOB but pt able to self correct. Pt returned RW to room after changing out slides on rear legs. Pt carried walker in BUE. Dynameter: RuE-24#, LUE-22#. Pt remained in recliner with all needs within reach. Pt pleased with progress and ready for discharge home tomorrow. ? ?Therapy Documentation ?Precautions:  ?Precautions ?Precautions: Cervical ?Precaution Booklet Issued: No ?Precaution Comments: verbally reviewed cervical precautions with pt ?Required Braces or Orthoses: Cervical Brace ?Cervical Brace: Hard collar, At all times ?Restrictions ?Weight Bearing Restrictions: No ?Pain: ? Pt reports she's a "little stiff and sore, but ok" ? ? ? ?Therapy/Group: Individual Therapy ? ?Leroy Libman ?03/27/2022, 8:16 AM ?

## 2022-03-27 NOTE — Progress Notes (Signed)
Physical Therapy Session Note ? ?Patient Details  ?Name: Alexandra Schmitt ?MRN: [id number] ?Date of Birth: [birth year] ? ?Today's Date: 03/27/2022 ?PT Individual Time: 1445-1530 ?PT Individual Time Calculation (min): 45 min  ? ?Short Term Goals: ?Week 2:  PT Short Term Goal 1 (Week 2): =LTG due to ELOS ? ?Skilled Therapeutic Interventions/Progress Updates:  ?  Pt received seated in recliner in room, agreeable to PT session. No complaints of pain. Pt performs transfers at mod I level throughout session with no AD. Pt also safe to be mod I in her room. Ambulation up to 500 ft during session with no AD at mod I level. Ascend/descend 12 x 6" stairs with R handrail and Supervision while ascending forwards instead of laterally to trial. Sidesteps L/R 2 x 30 ft with no AD and green theraband; monster walk forwards 4 x 30 ft with no AD and green theraband; backwards monster walk 2 x 30 ft with no AD and min A for balance with green theraband. Static standing balance with LLE placed on 1" step then progression to placement on dynadisc while shooting basketball hoops, CGA to min A for static standing balance. Quadruped alt UE/LE lifts 2 x 10-15 reps each to fatigue. Pt exhibits better R hip and LE control with decreased compensations noted this date. Pt returned to room and left seated in recliner with needs in reach. ? ?Therapy Documentation ?Precautions:  ?Precautions ?Precautions: Cervical ?Precaution Booklet Issued: No ?Precaution Comments: verbally reviewed cervical precautions with pt ?Required Braces or Orthoses: Cervical Brace ?Cervical Brace: Hard collar, At all times ?Restrictions ?Weight Bearing Restrictions: No ? ? ? ? ? ?Therapy/Group: Individual Therapy ? ? ?Peter Congo, PT, DPT, CSRS ?03/27/2022, 3:53 PM  ?

## 2022-03-27 NOTE — Progress Notes (Signed)
Occupational Therapy Session Note ? ?Patient Details  ?Name: Alexandra Schmitt ?MRN: [id number] ?Date of Birth: 1981/06/05 ? ?Today's Date: 03/27/2022 ?OT Individual Time: 0930-1000 ?OT Individual Time Calculation (min): 30 min  ? ? ?Short Term Goals: ?Week 2:  OT Short Term Goal 1 (Week 2): STG=LTG 2/2 ELOS (continue working towards mod I LTG) ? ?Skilled Therapeutic Interventions/Progress Updates:  ?  OT intervention with focus on standing balance tasks, tub transfers, functional amb without AD, home safety education, and discharge planning. Pt amb without AD throughout unit with supervision. Pt practiced tub transfers stepping over into tub with supervision. Tub transfers with TTB at mod I. Pt states she will use neighbor's walk-in shower until she orders a TTB for use at her home. Continued home safety education. Pt verbalizes all recommendations. Pt returned to room and remained in relciner. Pt is mod I in room. ? ?Therapy Documentation ?Precautions:  ?Precautions ?Precautions: Cervical ?Precaution Booklet Issued: No ?Precaution Comments: verbally reviewed cervical precautions with pt ?Required Braces or Orthoses: Cervical Brace ?Cervical Brace: Hard collar, At all times ?Restrictions ?Weight Bearing Restrictions: No ?Pain: ? Pt states she is "ok" ? ? ?Therapy/Group: Individual Therapy ? ?Rich Brave ?03/27/2022, 10:51 AM ?

## 2022-03-27 NOTE — Progress Notes (Signed)
Patient ID: Alexandra Schmitt, female   DOB: 1981-02-29, 41 y.o.   MRN: [id number] ? ?SW met with pt in room to provide updates from team conference, and confirm d/c tomorrow. SW confirms RW delivered. No questions/concerns reported.  ? ?Loralee Pacas, MSW, LCSWA ?Office: [phone] ?Cell: [phone] ?Fax: [phone]  ?

## 2022-03-27 NOTE — Progress Notes (Signed)
Physical Therapy Session Note ? ?Patient Details  ?Name: Alexandra Schmitt ?MRN: [id number] ?Date of Birth: 1981/05/09 ? ?Today's Date: 03/27/2022 ?PT Individual Time: 6147-0929 ?PT Individual Time Calculation (min): 55 min  ? ?Short Term Goals: ?Week 2:  PT Short Term Goal 1 (Week 2): =LTG due to ELOS ? ?Skilled Therapeutic Interventions/Progress Updates: Pt presented in bed agreeable to therapy. Pt does not endorse pain at this time. Pt participated in ambulation around unit at supervision to Mod I level without AD. Pt participated in functional activities in preparation for d/c. Pt ambulated to ortho gym mod I without AD and participated in car transfer mod I, gait up/down ramp, gait on uneven surfaces, and ascending/descending x 12 steps with 1 rail. All activities were performed at mod I level with increased time and pt demonstrating good safety awareness. Pt also participated in BITS visual motor tracking with inturrupted sequencing while standing on compliant surface for dynamic balance. Discussed safety at  home during rest breaks and reinforced energy conservation. Pt was able to ambulate hallways with laundry basket and 4lb weight in basket with supervision. In dayroom pt participated in obstacle course including stepping over thresholds, weaving through cones/tight spaces, and throwing ball while standing on compliant surface. Pt demonstrates good safety with all activities and no LOB noted. Pt also participated in "4 square" activities CW/CCW and diagonals. Pt expresses how happy she is with current progress. Pt ambulated back to room in same manner as prior and returned to recliner. Pt made Mod I in room and NT notified. Pt left in recliner at end of session with current needs met.     ? ?Therapy Documentation ?Precautions:  ?Precautions ?Precautions: Cervical ?Precaution Booklet Issued: No ?Precaution Comments: verbally reviewed cervical precautions with pt ?Required Braces or Orthoses: Cervical  Brace ?Cervical Brace: Hard collar, At all times ?Restrictions ?Weight Bearing Restrictions: No ?General: ?  ?Vital Signs: ? ?Pain: ?Pain Assessment ?Pain Scale: 0-10 ?Pain Score: 5  (pt requests to wait till 1400 for tylenol before next therapy appointment) ?Pain Type: Acute pain ?Pain Location:  (bilateral shoulders) ?Mobility: ?  ?Locomotion : ?   ?Trunk/Postural Assessment : ?   ?Balance: ?  ?Exercises: ?  ?Other Treatments:   ? ? ? ?Therapy/Group: Individual Therapy ? ?Kiaria Quinnell ?03/27/2022, 12:57 PM  ?

## 2022-03-27 NOTE — Progress Notes (Signed)
Inpatient Rehabilitation Discharge Medication Review by a Pharmacist ? ?A complete drug regimen review was completed for this patient to identify any potential clinically significant medication issues. ? ?High Risk Drug Classes Is patient taking? Indication by Medication  ?Antipsychotic No   ?Anticoagulant No   ?Antibiotic No   ?Opioid Yes OxyIR- acute pain  ?Antiplatelet No   ?Hypoglycemics/insulin No   ?Vasoactive Medication No   ?Chemotherapy No   ?Other Yes Gabapentin- neuropathic pain  ? ? ? ?Type of Medication Issue Identified Description of Issue Recommendation(s)  ?Drug Interaction(s) (clinically significant) ?    ?Duplicate Therapy ?    ?Allergy ?    ?No Medication Administration End Date ?    ?Incorrect Dose ?    ?Additional Drug Therapy Needed ?    ?Significant med changes from prior encounter (inform family/care partners about these prior to discharge).    ?Other ?    ? ? ?Clinically significant medication issues were identified that warrant physician communication and completion of prescribed/recommended actions by midnight of the next day:  No ? ?Time spent performing this drug regimen review (minutes):  30 ? ? ?Nasra Counce BS, PharmD, BCPS ?Clinical Pharmacist ?03/28/2022 8:31 AM ? ?Contact: [phone] after 3 PM ? ?"Be curious, not judgmental..." -Debbora Dus ?

## 2022-03-27 NOTE — Patient Care Conference (Signed)
Inpatient RehabilitationTeam Conference and Plan of Care Update Date: 03/27/2022   Time: 11:12 AM    Patient Name: Alexandra Schmitt [place] Record Number: [id number]  Date of Birth: 09/29/1981 Sex: Female         Room/Bed: 4W01C/4W01C-01 Payor Info: Payor: [employer] / Plan: CIG[employer] MANAGED / Product Type: *No Product type* /    Admit Date/Time:  03/16/2022  3:21 PM  Primary Diagnosis:  Acute incomplete quadriplegia [facility])  Hospital Problems: Principal Problem:   Acute incomplete quadriplegia ([place]) Active Problems:   Obesity   Cervical myelopathy ([place])   Ataxic gait   Neuropathy   Sensory disorder--BLE/BUE    Expected Discharge Date: Expected Discharge Date: 03/28/22  Team Members Present: Physician leading conference: Dr. Jennye Boroughs Social Worker Present: Loralee Pacas, [place] Nurse Present: Dorthula Nettles, RN PT Present: Excell Seltzer, PT OT Present: Roanna Epley, COTA;Jennifer Tamala Julian, OT PPS Coordinator present : Gunnar Fusi, SLP     Current Status/Progress Goal Weekly Team Focus  Bowel/Bladder   Continent of B/B  Remain continent  Toilet as needed   Swallow/Nutrition/ Hydration             ADL's   mod I overall with RW  mod I overall  safety awareness, IADLs, education, discharge planning   Mobility   Supervision to mod I overall with and without RW  mod I overall  d/c planning   Communication             Safety/Cognition/ Behavioral Observations            Pain   Some pain report. PRN medication available  Pain </=3/10  Assess Qshift and prn   Skin   neck incision, covered with foam  no new breakdown or infection  Assess Qshift and prn     Discharge Planning:  Pt will d/c to home with intermittent support from her neighbor who lives in the apartment building across from her. PRN support from friends/father to medical appointments, and meals.   Team Discussion: No complaints. Medically stable. Continent B/B, no reported pain. Neck incision CDI.  Set-up with outpatient. RW delivered.  Patient on target to meet rehab goals: yes, mod I. All goals met, mod I in the room.  *See Care Plan and progress notes for long and short-term goals.   Revisions to Treatment Plan:  Finalizing discharge.   Teaching Needs: Education completed.   Current Barriers to Discharge: No current barriers  Possible Resolutions to Barriers: All barriers addressed.     Medical Summary Current Status: cervical myelopathy, pain, obesity, constipation  Barriers to Discharge: Home enviroment access/layout;Other (comments);Medical stability;Medication compliance  Barriers to Discharge Comments: cervical myelopathy, pain, obesity, constipation Possible Resolutions to [employer] Focus: pain medications, muscle relaxers, stool softeners, diet education, follow  labs   Continued Need for Acute Rehabilitation Level of Care: The patient requires daily medical management by a physician with specialized training in physical medicine and rehabilitation for the following reasons: Direction of a multidisciplinary physical rehabilitation program to maximize functional independence : Yes Medical management of patient stability for increased activity during participation in an intensive rehabilitation regime.: Yes Analysis of laboratory values and/or radiology reports with any subsequent need for medication adjustment and/or medical intervention. : Yes   I attest that I was present, lead the team conference, and concur with the assessment and plan of the team.   Cristi Loron 03/27/2022, 3:06 PM

## 2022-03-27 NOTE — Progress Notes (Signed)
?                                                       PROGRESS NOTE ? ? ?Subjective/Complaints:  ?No new complaints or concerns this AM. Excited about going home tomorrow. ? ? ?ROS: Denies chest pain SOB nausea vomiting constipation diarrhea positive for issues with hemorrhoids and mild constipation.  Sensory changes and weakness which have improving. No HA ? ?Objective: ?  ?No results found. ?Recent Labs  ?  03/26/22 ?0656  ?WBC 7.1  ?HGB 13.4  ?HCT 39.6  ?PLT 414*  ? ? ?Recent Labs  ?  03/26/22 ?0656  ?NA 136  ?K 4.0  ?CL 104  ?CO2 24  ?GLUCOSE 97  ?BUN 8  ?CREATININE 0.64  ?CALCIUM 9.0  ? ? ? ?Intake/Output Summary (Last 24 hours) at 03/27/2022 0739 ?Last data filed at 03/26/2022 1917 ?Gross per 24 hour  ?Intake 480 ml  ?Output --  ?Net 480 ml  ? ?  ? ?  ? ?Physical Exam: ?Vital Signs ?Blood pressure (!) 108/54, pulse 83, temperature 98.2 ?F (36.8 ?C), resp. rate 17, height 5\' 6"  (1.676 m), weight 108 kg, last menstrual period 03/07/2022, SpO2 96 %. ? ? ?Physical exam: ? ?General: Alert and oriented x 3, No apparent distress.  BMI 38.41 ?HEENT: Head is normocephalic, atraumatic, PERRLA, EOMI, sclera anicteric, oral mucosa pink and moist, dentition intact, ext ear canals clear,  ?Neck: Supple without JVD or lymphadenopathy. Wearing C collar ?Heart: Reg rate and rhythm. No murmurs rubs or gallops ?Chest: CTA bilaterally without wheezes, rales, or rhonchi;  non-labored ?Abdomen: Soft, non-tender, non-distended, bowel sounds positive. ?Extremities: No clubbing, cyanosis, or edema.  ?Psych: Pt's affect is appropriate. Pt is cooperative. pleasant ?Skin: Cervical incision clean dry and intact. ?Neuro: Alert and oriented x4. Cognition intact.  CN II through XII grossly intact. Decreased sensation throughout all extremities has improved over the past week.  Does say that she does have numbness but does feel some improvement. Reports some numbness of her fingers on both right and left side, with left side more numb  especially left ring finger and pinky. Improved sensation of her bilateral anterior thighs. ?Musculoskeletal: 4/5 strength throughout.  ?Ambulating S w/ RW ? ?Assessment/Plan: ?1. Functional deficits which require 3+ hours per day of interdisciplinary therapy in a comprehensive inpatient rehab setting. ?Physiatrist is providing close team supervision and 24 hour management of active medical problems listed below. ?Physiatrist and rehab team continue to assess barriers to discharge/monitor patient progress toward functional and medical goals ? ?Care Tool: ? ?Bathing ?   ?Body parts bathed by patient: Right arm, Left arm, Chest, Abdomen, Front perineal area, Buttocks, Right upper leg, Left upper leg, Face, Right lower leg, Left lower leg  ? Body parts bathed by helper: Right lower leg, Left lower leg ?  ?  ?Bathing assist Assist Level: Independent with assistive device ?  ?  ?Upper Body Dressing/Undressing ?Upper body dressing   ?What is the patient wearing?: Bra, Pull over shirt ?   ?Upper body assist Assist Level: Independent ?   ?Lower Body Dressing/Undressing ?Lower body dressing ? ? ?   ?What is the patient wearing?: Pants ? ?  ? ?Lower body assist Assist for lower body dressing: Independent with assitive device ?   ? ?Toileting ?Toileting    ?  Toileting assist Assist for toileting: Independent with assistive device ?  ?  ?Transfers ?Chair/bed transfer ? ?Transfers assist ?   ? ?Chair/bed transfer assist level: Supervision/Verbal cueing ?  ?  ?Locomotion ?Ambulation ? ? ?Ambulation assist ? ?   ? ?Assist level: Supervision/Verbal cueing ?Assistive device: No Device ?Max distance: 500'  ? ?Walk 10 feet activity ? ? ?Assist ?   ? ?Assist level: Supervision/Verbal cueing ?Assistive device: No Device  ? ?Walk 50 feet activity ? ? ?Assist   ? ?Assist level: Supervision/Verbal cueing ?Assistive device: No Device  ? ? ?Walk 150 feet activity ? ? ?Assist   ? ?Assist level: Supervision/Verbal cueing ?Assistive device: No  Device ?  ? ?Walk 10 feet on uneven surface  ?activity ? ? ?Assist Walk 10 feet on uneven surfaces activity did not occur: Safety/medical concerns ? ? ?Assist level: Contact Guard/Touching assist ?Assistive device: Walker-rolling  ? ?Wheelchair ? ? ? ? ?Assist Is the patient using a wheelchair?: Yes ?Type of Wheelchair: Manual ?  ? ?Wheelchair assist level: Supervision/Verbal cueing ?Max wheelchair distance: 150  ? ? ?Wheelchair 50 feet with 2 turns activity ? ? ? ?Assist ? ?  ?  ? ? ?Assist Level: Supervision/Verbal cueing  ? ?Wheelchair 150 feet activity  ? ? ? ?Assist ?   ? ? ?Assist Level: Supervision/Verbal cueing  ? ?Blood pressure (!) 108/54, pulse 83, temperature 98.2 ?F (36.8 ?C), resp. rate 17, height 5\' 6"  (1.676 m), weight 108 kg, last menstrual period 03/07/2022, SpO2 96 %. ? ? ? ?Medical Problem List and Plan: ?1. Functional deficits secondary to cervical myelopathy ?            -patient may shower but incision must be covered ?            -ELOS/Goals: modI 10-14 days        ?            -Admit to CIR, d/c tomorrow ?Conitnue CIR- PT, OT - we discussed will need f/u with me after d/c- also will need pain meds at d/c and d/c 9-11am ?2.  Impaired mobility: ambulating much better now, can d/c lovenox.  ?3. Incisional pain: continue Oxycodone prn ?5/7 continue oxycodone as needed 5 to 10 mg every 4 hours for severe pain also may utilize Robaxin 500 mg tablet every 6 hours for muscle spasms ?5/16 Pain well controlled, not requiring PRN pain meds recently, continue current regimen ?4. Mood: LCSW to follow for evaluation and support.  ?            -antipsychotic agents: N/A ?5. Neuropsych: This patient is capable of making decisions on her own behalf. ?6. Skin/Wound Care: Routine pressure relief measures.  ?7. Fluids/Electrolytes/Nutrition: Monitor I/O. Check CMET in am ?8. ADHD/Anxiety: In the past-->no meds for years. ?9.  Myalgias: add kpad, continue prn robaxin.  ?5/7 continue utilize Robaxin 500 mg tablet  every 6 hours for muscle spasms ?10. Morbid obesity: BMI 38.41: provide dietary education ?5/7 continue dietary education as well as physical therapy and progressive exercise ?11. Burning thoracic pain; lumbar MRI imaging suggestive of thoracic spine pedicle fractures: CT ordered to further assess. ?5/5 MRI findings are described as below: ?IMPRESSION: ?1. T1 hypointensity with associated T2/STIR hyperintensity in the ?bilateral T12 pedicles, right more than left, raise suspicion for ?acute to subacute fractures. Correlate with point tenderness, and ?consider CT to evaluate for a fracture plane. ?2. Prominent central protrusion at L4-L5 which narrows the ?subarticular zones and may irritate  the traversing right L5 nerve ?root. ?3. Overall mild degenerative changes throughout the remainder of the ?lumbar spine as above without significant spinal canal or neural ?foraminal stenosis. ?5/6 CT today showed results that did not indicate fracture. ?Impression findings are as described below: ?1. Partially visible postoperative soft tissue inflammation in the ?lower neck status post recent cervical ACDF. Minimal associated ?inflammation in the superior mediastinum. ?2. No acute osseous abnormality in the thoracic spine. 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 And degenerative appearing sclerosis of ?the bilateral T12 pedicles and costovertebral junctions. ?3. 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 ?-5/6 May continue to utilize pain strategies as outlined above. ?12. Constipation: Senna 2 tabs HS ?-5/6 continue senna 2 tabs at bedtime continue mobility to promote bowel function.  May utilize as needed's if needed does have available MiraLAX 17 g as needed for mild constipation  Dulcolax suppository 10 mg for moderate constipation and a fleets enema for severe constipation. ?-LBM charted as yesterday 03/16/2022. ?5/7 LBM today, pt reports she feels relieved. ? 5/8- LBM yesterday- no

## 2022-03-28 MED ORDER — WITCH HAZEL-GLYCERIN EX PADS: MEDICATED_PAD | CUTANEOUS | 12 refills | Status: AC | PRN

## 2022-03-28 MED ORDER — GABAPENTIN 100 MG PO CAPS: 100.0000 mg | ORAL_CAPSULE | Freq: Every day | ORAL | 0 refills | Status: AC

## 2022-03-28 MED ORDER — METHOCARBAMOL 500 MG PO TABS: 500.0000 mg | ORAL_TABLET | Freq: Four times a day (QID) | ORAL | 0 refills | Status: AC | PRN

## 2022-03-28 MED ORDER — SENNOSIDES-DOCUSATE SODIUM 8.6-50 MG PO TABS: 2.0000 | ORAL_TABLET | Freq: Every day | ORAL | 0 refills | Status: AC

## 2022-03-28 MED ORDER — OXYCODONE HCL 5 MG PO TABS: 5.0000 mg | ORAL_TABLET | Freq: Every day | ORAL | 0 refills | Status: AC | PRN

## 2022-03-28 MED ORDER — HYDROCORTISONE 1 % EX CREA: TOPICAL_CREAM | Freq: Three times a day (TID) | CUTANEOUS | 0 refills | Status: AC | PRN

## 2022-03-28 NOTE — Progress Notes (Signed)
?                                                       PROGRESS NOTE ? ? ?Subjective/Complaints:  ?Looking forward to going home. No new concerns. BM yesterday. ? ? ?ROS: Denies chest pain SOB nausea vomiting constipation diarrhea, Sensory changes and weakness which have improving. Denies dysphagia  ? ?Objective: ?  ?No results found. ?Recent Labs  ?  03/26/22 ?0656  ?WBC 7.1  ?HGB 13.4  ?HCT 39.6  ?PLT 414*  ? ? ?Recent Labs  ?  03/26/22 ?0656  ?NA 136  ?K 4.0  ?CL 104  ?CO2 24  ?GLUCOSE 97  ?BUN 8  ?CREATININE 0.64  ?CALCIUM 9.0  ? ? ? ?Intake/Output Summary (Last 24 hours) at 03/28/2022 0742 ?Last data filed at 03/28/2022 0735 ?Gross per 24 hour  ?Intake 820 ml  ?Output --  ?Net 820 ml  ? ?  ? ?  ? ?Physical Exam: ?Vital Signs ?Blood pressure 117/60, pulse 77, temperature 98.1 ?F (36.7 ?C), resp. rate 17, height 5\' 6"  (1.676 m), weight 108 kg, last menstrual period 03/07/2022, SpO2 97 %. ? ? ?Physical exam: ? ?General: Alert and oriented x 3, No apparent distress. ?HEENT: Head is normocephalic, atraumatic, oral mucosa pink and moist ?Neck: Wearing C collar ?Heart: Reg rate and rhythm. No murmurs rubs or gallops ?Chest: CTA bilaterally without wheezes, rales, or rhonchi;  non-labored ?Abdomen: Soft, non-tender, non-distended, bowel sounds positive. ?Extremities: No clubbing, cyanosis, or edema.  ?Psych: Pt's affect is appropriate. Pt is cooperative. pleasant ?Skin: Cervical incision clean dry and intact. ?Neuro: Alert and oriented x4. Cognition intact.  CN II through XII grossly intact. Decreased sensation throughout all extremities has improved over the past week.  Reports some numbness of her fingers on both right and left side, with left side more numb especially left ring finger and pinky. Improved sensation of her bilateral anterior thighs. ?Musculoskeletal: 4-4+/5 strength throughout ?Ambulating S w/ RW ? ?Assessment/Plan: ?1. Functional deficits which require 3+ hours per day of interdisciplinary therapy in  a comprehensive inpatient rehab setting. ?Physiatrist is providing close team supervision and 24 hour management of active medical problems listed below. ?Physiatrist and rehab team continue to assess barriers to discharge/monitor patient progress toward functional and medical goals ? ?Care Tool: ? ?Bathing ?   ?Body parts bathed by patient: Right arm, Left arm, Chest, Abdomen, Front perineal area, Buttocks, Right upper leg, Left upper leg, Face, Right lower leg, Left lower leg  ? Body parts bathed by helper: Right lower leg, Left lower leg ?  ?  ?Bathing assist Assist Level: Independent with assistive device ?  ?  ?Upper Body Dressing/Undressing ?Upper body dressing   ?What is the patient wearing?: Bra, Pull over shirt ?   ?Upper body assist Assist Level: Independent ?   ?Lower Body Dressing/Undressing ?Lower body dressing ? ? ?   ?What is the patient wearing?: Pants ? ?  ? ?Lower body assist Assist for lower body dressing: Independent with assitive device ?   ? ?Toileting ?Toileting    ?Toileting assist Assist for toileting: Independent with assistive device ?  ?  ?Transfers ?Chair/bed transfer ? ?Transfers assist ?   ? ?Chair/bed transfer assist level: Independent with assistive device ?Chair/bed transfer assistive device: Armrests ?  ?Locomotion ?Ambulation ? ? ?Ambulation  assist ? ?   ? ?Assist level: Independent with assistive device ?Assistive device: No Device ?Max distance: 342ft  ? ?Walk 10 feet activity ? ? ?Assist ?   ? ?Assist level: Independent with assistive device ?Assistive device: No Device  ? ?Walk 50 feet activity ? ? ?Assist   ? ?Assist level: Independent with assistive device ?Assistive device: No Device  ? ? ?Walk 150 feet activity ? ? ?Assist   ? ?Assist level: Independent with assistive device ?Assistive device: No Device ?  ? ?Walk 10 feet on uneven surface  ?activity ? ? ?Assist Walk 10 feet on uneven surfaces activity did not occur: Safety/medical concerns ? ? ?Assist level:  Supervision/Verbal cueing ?Assistive device: Walker-rolling  ? ?Wheelchair ? ? ? ? ?Assist Is the patient using a wheelchair?: No ?Type of Wheelchair: Manual ?  ? ?Wheelchair assist level: Supervision/Verbal cueing ?Max wheelchair distance: 150  ? ? ?Wheelchair 50 feet with 2 turns activity ? ? ? ?Assist ? ?  ?  ? ? ?Assist Level: Supervision/Verbal cueing  ? ?Wheelchair 150 feet activity  ? ? ? ?Assist ?   ? ? ?Assist Level: Supervision/Verbal cueing  ? ?Blood pressure 117/60, pulse 77, temperature 98.1 ?F (36.7 ?C), resp. rate 17, height 5\' 6"  (1.676 m), weight 108 kg, last menstrual period 03/07/2022, SpO2 97 %. ? ? ? ?Medical Problem List and Plan: ?1. Functional deficits secondary to cervical myelopathy ?            -patient may shower but incision must be covered ?            -ELOS/Goals: modI 10-14 days        ?            -DC today ?2.  Impaired mobility: ambulating much better now, can d/c lovenox.  ?3. Incisional pain: continue Oxycodone prn ?5/7 continue oxycodone as needed 5 to 10 mg every 4 hours for severe pain also may utilize Robaxin 500 mg tablet every 6 hours for muscle spasms ?5/16 Pain well controlled, not requiring PRN pain meds recently, continue current regimen ?4. Mood: LCSW to follow for evaluation and support.  ?            -antipsychotic agents: N/A ?5. Neuropsych: This patient is capable of making decisions on her own behalf. ?6. Skin/Wound Care: Routine pressure relief measures.  ?7. Fluids/Electrolytes/Nutrition: Monitor I/O. Check CMET in am ?8. ADHD/Anxiety: In the past-->no meds for years. ?9.  Myalgias: add kpad, continue prn robaxin.  ?5/7 continue utilize Robaxin 500 mg tablet every 6 hours for muscle spasms ?5/17 improved, last robaxin PRN 5/15, continue current plan ?10. Morbid obesity: BMI 38.41: provide dietary education ?5/7 continue dietary education as well as physical therapy and progressive exercise ?11. Burning thoracic pain; lumbar MRI imaging suggestive of thoracic  spine pedicle fractures: CT ordered to further assess. ?5/5 MRI findings are described as below: ?IMPRESSION: ?1. T1 hypointensity with associated T2/STIR hyperintensity in the ?bilateral T12 pedicles, right more than left, raise suspicion for ?acute to subacute fractures. Correlate with point tenderness, and ?consider CT to evaluate for a fracture plane. ?2. Prominent central protrusion at L4-L5 which narrows the ?subarticular zones and may irritate the traversing right L5 nerve ?root. ?3. Overall mild degenerative changes throughout the remainder of the ?lumbar spine as above without significant spinal canal or neural ?foraminal stenosis. ?5/6 CT today showed results that did not indicate fracture. ?Impression findings are as described below: ?1. Partially visible postoperative soft tissue  inflammation in the ?lower neck status post recent cervical ACDF. Minimal associated ?inflammation in the superior mediastinum. ?2. No acute osseous abnormality in the thoracic spine. 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 And degenerative appearing sclerosis of ?the bilateral T12 pedicles and costovertebral junctions. ?3. 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 ?-5/6 May continue to utilize pain strategies as outlined above. ?12. Constipation: Senna 2 tabs HS ?-5/6 continue senna 2 tabs at bedtime continue mobility to promote bowel function.  May utilize as needed's if needed does have available MiraLAX 17 g as needed for mild constipation Dulcolax suppository 10 mg for moderate constipation and a fleets enema for severe constipation. ?-LBM charted as yesterday 03/16/2022. ?5/7 LBM today, pt reports she feels relieved. ? 5/8- LBM yesterday- not constipated anymore ?            5/9-  LBM-s mall BM yesterday- thinks she can go today- don't want her to get backed up again. ?        5/11- LBM 2 days ago but feels could go today.  ?        5/12- LBM yesterday- medium ?        5/13 No BM today, pt r

## 2022-03-28 NOTE — Progress Notes (Signed)
Recreational Therapy Discharge Summary ?Patient Details  ?Name: Alexandra Schmitt ?MRN: [id number] ?Date of Birth: November 28, 1980 ?Today's Date: 03/28/2022 ? ?Long term goals set: 1 ? ?Long term goals met: 1 ? ?Comments on progress toward goals: Pt has made good progress during LOS and is ready for discharge home today.  TR sessions focused on leisure education, activity analysis/modifications, stress management & community reintegration.  Pt met Pt is discharging at supervision level for community mobility tasks and with intermittent supervision from family/friends.  Pt remains upbeat and optimistic about continued recovery. ?Reasons for discharge: discharge from hospital ?discharge from hospital ?Follow-up: Outpatient ? ?Patient/family agrees with progress made and goals achieved: Yes ? ?Ethan Kasperski ?03/28/2022, 8:22 AM ? ? ?

## 2022-03-28 NOTE — Progress Notes (Signed)
Inpatient Rehabilitation Care Coordinator ?Discharge Note  ? ?Patient Details  ?Name: Alexandra Schmitt ?MRN: [id number] ?Date of Birth: Sep 05, 1981 ? ? ?Discharge location: D/c to home ? ?Length of Stay: 11 days ? ?Discharge activity level: Mod I ? ?Home/community participation: Limited ? ?Patient response TM:[id number] Literacy - How often do you need to have someone help you when you read instructions, pamphlets, or other written material from your doctor or pharmacy?: Never ? ?Patient response TW:[id number] Isolation - How often do you feel lonely or isolated from those around you?: Never ? ?Services provided included: MD, RD, PT, OT, RN, CM, TR, SW, Neuropsych, Pharmacy ? ?Financial Services:  ?Field seismologist Utilized: Private Insurance ?[employer] ? ?Choices offered to/list presented to: yes ? ?Follow-up services arranged:  ?Outpatient, DME ?   ?Outpatient Servicies: [facility] for PT/OT ?DME : [facility] for RW ?  ? ?Patient response to transportation need: ?Is the patient able to respond to transportation needs?: Yes ?In the past 12 months, has lack of transportation kept you from medical appointments or from getting medications?: No ?In the past 12 months, has lack of transportation kept you from meetings, work, or from getting things needed for daily living?: No ? ?Comments (or additional information): ? ?Patient/Family verbalized understanding of follow-up arrangements:  Yes ? ?Individual responsible for coordination of the follow-up plan: contact pt ? ?Confirmed correct DME delivered: Alexandra Schmitt 03/28/2022   ? ?Alexandra Schmitt ?

## 2022-03-28 NOTE — Discharge Summary (Signed)
Physician Discharge Summary  Patient ID: Alexandra Schmitt MRN: [id number] DOB/AGE: 05-09-1981 41 y.o.  Admit date: 03/16/2022 Discharge date: 03/28/2022  Discharge Diagnoses:  Principal Problem:   Acute incomplete quadriplegia (HCC) Active Problems:   Obesity   Cervical myelopathy (HCC)   Ataxic gait   Neuropathy   Sensory disorder--BLE/BUE   Discharged Condition: good  Significant Diagnostic Studies: CT THORACIC SPINE WO CONTRAST  Result Date: 03/17/2022 CLINICAL DATA:  41 year old female with fall. Pain. Question of T12 fracture. EXAM: CT THORACIC SPINE WITHOUT CONTRAST TECHNIQUE: Multidetector CT images of the thoracic were obtained using the standard protocol without intravenous contrast. RADIATION DOSE REDUCTION: This exam was performed according to the departmental dose-optimization program which includes automated exposure control, adjustment of the mA and/or kV according to patient size and/or use of iterative reconstruction technique. COMPARISON:  Lumbar MRI 03/10/2022. Intraoperative cervical spine radiographs 03/13/2022. FINDINGS: Limited cervical spine imaging: Cervicothoracic junction alignment is within normal limits. Thoracic spine segmentation: T9 and T12 butterfly vertebrae. Otherwise normal. Alignment: Relatively normal thoracic kyphosis. No spondylolisthesis. Subtle dextroconvex thoracic scoliosis. 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 Background bone mineralization within normal limits. No acute osseous abnormality identified. There is degenerative appearing asymmetric sclerosis of the T12 pedicles and costovertebral junctions greater on the left. Paraspinal and other soft tissues: Partially visible multi spatial soft tissue inflammation and swelling just above the thoracic inlet (series 4, image 7) eccentric to the right and likely associated with the 3 level cervical ACDF (C4, C5, C6) depicted on the recent intraoperative radiographs. Minimal inflammation  tracking along the tracheoesophageal groove into the superior mediastinum. Otherwise thoracic paraspinal soft tissues are within normal limits on this noncontrast exam. No mediastinal hematoma. Small reactive appearing superior mediastinal lymph nodes. Visible major airways are patent. Lower lobe atelectasis left greater than right. No pleural effusion. No pneumothorax is visible. No pericardial effusion. Negative visible noncontrast upper abdominal viscera. Disc levels: Generally mild for age thoracic spine degeneration with the following exceptions: T2-T3: Moderate to severe ligament flavum hypertrophy which is partially calcified. Mild to moderate facet hypertrophy. But no significant spinal stenosis suspected. T10-T11: Mild to moderate facet and ligament flavum hypertrophy greater on the right. No spinal stenosis suspected. T11-T12: Widespread disc bulging and mild endplate spurring. This level visible on the April MRI, with borderline spinal stenosis depicted. T12-L1: Mild disc bulging and endplate spurring. This level visible on the April MRI with no significant spinal stenosis. IMPRESSION: 1. Partially visible postoperative soft tissue inflammation in the lower neck status post recent cervical ACDF. Minimal associated inflammation in the superior mediastinum. 2. No acute osseous abnormality in the thoracic spine. Congenital T9 and T12 butterfly vertebrae. And degenerative appearing sclerosis of the bilateral T12 pedicles and costovertebral junctions. 3. 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 Electronically Signed   By: Odessa Fleming M.D.   On: 03/17/2022 10:17   MR CERVICAL SPINE WO CONTRAST  Result Date: 03/12/2022 CLINICAL DATA:  Whole-body numbness and weakness,  symptoms following a fall on 02/11/2022 EXAM: MRI CERVICAL SPINE WITHOUT CONTRAST TECHNIQUE: Multiplanar, multisequence MR imaging of the cervical spine was performed. No intravenous contrast was administered. COMPARISON:  None. FINDINGS: Alignment: Normal. Vertebrae: Vertebral body heights are preserved. Marrow signal is normal. There is no suspicious marrow signal abnormality or marrow edema.  Cord: There is cord signal abnormality at C5-C6 consistent with edema/myelomalacia. Cord is otherwise normal in signal and morphology. Posterior Fossa, vertebral arteries, paraspinal tissues: The imaged posterior fossa is unremarkable. The vertebral artery flow voids are normal. Paraspinal soft tissues are unremarkable. Disc levels: There is disc desiccation and narrowing at C4-C5 and C5-C6. The other disc heights are preserved. C2-C3: No significant spinal canal or neural foraminal stenosis C3-C4: There is mild right uncovertebral ridging resulting in mild right and no significant left neural foraminal stenosis and no significant spinal canal stenosis C4-C5: There is a prominent central protrusion resulting in moderate to severe spinal canal stenosis with mass effect on the ventral cord surface and mild right worse than left neural foraminal stenosis C5-C6: There is a posterior disc osteophyte complex with a prominent central protrusion/inferior extrusion, right worse than left uncovertebral ridging, and right worse than left facet arthropathy resulting in severe spinal canal stenosis with cord compression and cord signal abnormality and severe right and moderate left neural foraminal stenosis. C6-C7: No significant spinal canal or neural foraminal stenosis C7-T1: No significant spinal canal or neural foraminal stenosis. IMPRESSION: 1. Prominent protrusion/inferiorly migrated extrusion at C5-C6 resulting in severe spinal canal stenosis with cord compression and cord edema/myelomalacia. Uncovertebral ridging and facet  arthropathy also contributes to severe right and moderate left neural foraminal stenosis at this level. 2. Prominent central protrusion at C4-C5 resulting in moderate to severe spinal canal stenosis with cord compression but no definite cord signal abnormality. These results were called by telephone at the time of interpretation on 03/12/2022 at 1:04 pm to provider TROY DAWLEY , who verbally acknowledged these results. Electronically Signed   By: Lesia Hausen M.D.   On: 03/12/2022 13:02    Labs:  Basic Metabolic Panel:    Latest Ref Rng & Units 03/26/2022    6:56 AM 03/19/2022    5:44 AM 02/23/2015   11:04 AM  BMP  Glucose 70 - 99 mg/dL 97   267   97    BUN 6 - 20 mg/dL 8   9     Creatinine 1.24 - 1.00 mg/dL 5.80   9.98     Sodium 135 - 145 mmol/L 136   136     Potassium 3.5 - 5.1 mmol/L 4.0   4.5     Chloride 98 - 111 mmol/L 104   104     CO2 22 - 32 mmol/L 24   26     Calcium 8.9 - 10.3 mg/dL 9.0   9.2        CBC:    Latest Ref Rng & Units 03/26/2022    6:56 AM 03/19/2022    5:44 AM 03/13/2022    5:59 AM  CBC  WBC 4.0 - 10.5 K/uL 7.1   7.4   7.7    Hemoglobin 12.0 - 15.0 g/dL 33.8   25.0   53.9    Hematocrit 36.0 - 46.0 % 39.6   44.9   39.5    Platelets 150 - 400 K/uL 414   290   373       CBG: No results for input(s): GLUCAP in the last 168 hours.  Brief HPI:   Alexandra Schmitt is a 41 y.o. female with history of ADHD otherwise in good health who started developing BUE weakness with numbness and tingling as well as difficulty walking after a fall on 02/11/2022.  She was evaluated by Dr. 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  With osteophyte as well as cord signal changes.  She was admitted on 03/13/2022 for ACDF C4-C6 with recommendations to wear cervical collar at all times.  PT OT was ongoing and patient was limited by balance deficits with unsteady gait, sensory deficit BUE and BLE as well as weakness and neck pain affecting mobility and ADLs.   CIR was recommended due to functional decline.   Hospital Course: Alexandra Schmitt was admitted to rehab 03/16/2022 for inpatient therapies to consist of PT and OT at least three hours five days a week. Past admission physiatrist, therapy team and rehab RN have worked together to provide customized collaborative inpatient rehab.  K-pad was added to help with myalgias in addition to Robaxin on as needed basis.  Low-dose gabapentin was added to help manage dysesthesias.  Pain control is improved with decreasing use of as needed oxycodone.  Her blood pressures were monitored on TID basis and have been controlled.  Neck incision is C/D/and has been healing well without any signs or symptoms of infection.  Staples were removed removed by neurosurgery on 05/11.  Her mood has been stable and she has made steady and good gains during her rehab stay.  She has had improvement in strength as well as decrease in neuropathic symptoms BLE greater than BUE.  She will continue to receive follow up outpatient PT and OT at [facility] after discharge.    Rehab course: During patient's stay in rehab weekly team conferences were held to monitor patient's progress, set goals and discuss barriers to discharge. At admission, patient required min assist with basic ADL task as well as mobility. She  has had improvement in activity tolerance, balance, postural control as well as ability to compensate for deficits. She has had improvement in functional use RUE/LUE  and RLE/LLE as well as improvement in awareness.  She is able to complete ADL tasks at modified independent level. She is modified independent for transfers and to ambulate 1000 feet with rolling walker.  Disposition:  Home  Diet: Regular.   Special Instructions: No driving or strenuous activity till cleared by NS. Continue to wear collar at all times till cleared by NS.   Allergies as of 03/28/2022   No Known Allergies      Medication List     STOP  taking these medications    ibuprofen 200 MG tablet Commonly known as: ADVIL       TAKE these medications    ALPRAZolam 0.5 MG tablet Commonly known as: XANAX Take 0.5 mg by mouth at bedtime as needed for sleep or anxiety.   gabapentin 100 MG capsule Commonly known as: NEURONTIN Take 1 capsule (100 mg total) by mouth at bedtime.   hydrocortisone cream 1 % Apply topically 3 (three) times daily as needed for itching.   LUBRICANT EYE DROPS OP Place 1 drop into both eyes daily as needed (dry eyes).   methocarbamol 500 MG tablet Commonly known as: ROBAXIN Take 1 tablet (500 mg total) by mouth every 6 (six) hours as needed for muscle spasms.   oxyCODONE 5 MG immediate release tablet--Rx# 5 pills.  Commonly known as: Oxy IR/ROXICODONE Take 1 tablet (5 mg total) by mouth daily as needed for severe pain ((score 7 to 10)).   senna-docusate 8.6-50 MG tablet Commonly known as: Senokot-S Take 2 tablets by mouth daily after supper.   witch hazel-glycerin pad Commonly known as: TUCKS Apply topically as needed for itching.  Follow-up Information     Lovorn, Aundra Millet, MD Follow up.   Specialty: Physical Medicine and Rehabilitation Why: office will call you with follow up appointment Contact information: [address] [place] [address] [phone]         Dawley, Kendell Bane C, DO Follow up.   Why: call for follow up appointment Contact information: [address] [place] [address] [place] [address] [phone]                 Signed: Jacquelynn Cree 03/28/2022, 6:14 PM

## 2022-03-28 NOTE — Progress Notes (Signed)
INPATIENT REHABILITATION DISCHARGE NOTE ? ? ?Discharge instructions by: Algis Liming, PA ? ?Verbalized understanding: Yes ? ?Skin care/Wound care healing? Yes ? ?Pain: none ? ?IV's: none ? ?Tubes/Drains: none ? ?O2: none ? ?Safety instructions: reviewed with pt ? ?Patient belongings: sent with pt ? ?Discharged to: home ? ?Discharged via: family transport ? ?Notes: done ? ? ? ? ? ? ?  ?

## 2022-03-29 ENCOUNTER — Telehealth: Payer: Self-pay | Admitting: Physical Medicine and Rehabilitation

## 2022-03-29 NOTE — Telephone Encounter (Signed)
Per Etheleen Sia can set patient up with a hosp f/u with Dr. Berline Chough or Dr. Natale Lay, please give patient a preference.  We would just need to see her 6-8 weeks whatever is available in June or July for either doctor.  She would need a general H&H mailed in her new pt packet.  Thank you.

## 2022-04-17 ENCOUNTER — Ambulatory Visit: Payer: Managed Care, Other (non HMO) | Admitting: Family Medicine

## 2023-02-18 ENCOUNTER — Telehealth: Payer: Self-pay | Admitting: Family Medicine

## 2023-02-18 NOTE — Telephone Encounter (Signed)
Pt's father called stating Dr Clent Ridges agreed to take her on as a new patient. Please advise

## 2023-02-20 NOTE — Telephone Encounter (Signed)
Yes I agreed to see her  

## 2023-02-20 NOTE — Telephone Encounter (Signed)
Please advise 

## 2023-02-22 NOTE — Telephone Encounter (Signed)
Send pt a MyChart message due to voicemail being full
